# Patient Record
Sex: Male | Born: 1935
Health system: Southern US, Community
[De-identification: ages and names within clinical notes are randomized; demographics above are authoritative.]

## PROBLEM LIST (undated history)

## (undated) DIAGNOSIS — J3089 Other allergic rhinitis: Secondary | ICD-10-CM

## (undated) DIAGNOSIS — R0600 Dyspnea, unspecified: Secondary | ICD-10-CM

## (undated) DIAGNOSIS — E785 Hyperlipidemia, unspecified: Secondary | ICD-10-CM

## (undated) DIAGNOSIS — I10 Essential (primary) hypertension: Secondary | ICD-10-CM

## (undated) DIAGNOSIS — M1990 Unspecified osteoarthritis, unspecified site: Secondary | ICD-10-CM

## (undated) DIAGNOSIS — K219 Gastro-esophageal reflux disease without esophagitis: Secondary | ICD-10-CM

## (undated) DIAGNOSIS — H9190 Unspecified hearing loss, unspecified ear: Secondary | ICD-10-CM

## (undated) DIAGNOSIS — R42 Dizziness and giddiness: Secondary | ICD-10-CM

## (undated) DIAGNOSIS — R251 Tremor, unspecified: Secondary | ICD-10-CM

## (undated) HISTORY — PX: CATARACT EXTRACTION W/ INTRAOCULAR LENS IMPLANT: SHX1309

## (undated) HISTORY — PX: OTHER SURGICAL HISTORY: SHX169

---

## 2006-01-12 ENCOUNTER — Ambulatory Visit: Payer: Self-pay | Admitting: Ophthalmology

## 2006-01-18 ENCOUNTER — Ambulatory Visit: Payer: Self-pay | Admitting: Ophthalmology

## 2007-05-19 ENCOUNTER — Emergency Department: Payer: Self-pay | Admitting: Emergency Medicine

## 2010-05-14 ENCOUNTER — Ambulatory Visit: Payer: Self-pay | Admitting: Internal Medicine

## 2012-06-21 ENCOUNTER — Ambulatory Visit: Payer: Self-pay | Admitting: Internal Medicine

## 2012-06-23 ENCOUNTER — Ambulatory Visit: Payer: Self-pay | Admitting: Internal Medicine

## 2013-07-31 ENCOUNTER — Ambulatory Visit: Payer: Self-pay | Admitting: Physical Medicine and Rehabilitation

## 2014-07-07 DIAGNOSIS — N401 Enlarged prostate with lower urinary tract symptoms: Secondary | ICD-10-CM | POA: Diagnosis present

## 2015-04-19 ENCOUNTER — Emergency Department
Admit: 2015-04-19 | Disposition: A | Discharge status: Home / Self Care | Payer: Self-pay | Admitting: Emergency Medicine

## 2017-04-04 DIAGNOSIS — E1122 Type 2 diabetes mellitus with diabetic chronic kidney disease: Secondary | ICD-10-CM | POA: Diagnosis not present

## 2017-04-04 DIAGNOSIS — E785 Hyperlipidemia, unspecified: Secondary | ICD-10-CM | POA: Diagnosis not present

## 2017-04-04 DIAGNOSIS — E1169 Type 2 diabetes mellitus with other specified complication: Secondary | ICD-10-CM | POA: Diagnosis not present

## 2017-04-04 DIAGNOSIS — N183 Chronic kidney disease, stage 3 (moderate): Secondary | ICD-10-CM | POA: Diagnosis not present

## 2017-04-11 DIAGNOSIS — N183 Chronic kidney disease, stage 3 (moderate): Secondary | ICD-10-CM | POA: Diagnosis not present

## 2017-04-11 DIAGNOSIS — E785 Hyperlipidemia, unspecified: Secondary | ICD-10-CM | POA: Diagnosis not present

## 2017-04-11 DIAGNOSIS — I129 Hypertensive chronic kidney disease with stage 1 through stage 4 chronic kidney disease, or unspecified chronic kidney disease: Secondary | ICD-10-CM | POA: Diagnosis not present

## 2017-04-11 DIAGNOSIS — I700 Atherosclerosis of aorta: Secondary | ICD-10-CM | POA: Diagnosis not present

## 2017-04-11 DIAGNOSIS — R739 Hyperglycemia, unspecified: Secondary | ICD-10-CM | POA: Diagnosis not present

## 2017-04-11 DIAGNOSIS — E1169 Type 2 diabetes mellitus with other specified complication: Secondary | ICD-10-CM | POA: Diagnosis not present

## 2017-05-18 DIAGNOSIS — H2511 Age-related nuclear cataract, right eye: Secondary | ICD-10-CM | POA: Diagnosis not present

## 2017-05-24 DIAGNOSIS — H2512 Age-related nuclear cataract, left eye: Secondary | ICD-10-CM | POA: Diagnosis not present

## 2017-05-30 ENCOUNTER — Encounter: Payer: Self-pay | Admitting: *Deleted

## 2017-06-07 ENCOUNTER — Ambulatory Visit
Admission: RE | Admit: 2017-06-07 | Discharge: 2017-06-07 | Disposition: A | Discharge status: Home / Self Care | Payer: Medicare HMO | Source: Ambulatory Visit | Attending: Ophthalmology | Admitting: Ophthalmology

## 2017-06-07 ENCOUNTER — Ambulatory Visit: Payer: Medicare HMO | Admitting: Anesthesiology

## 2017-06-07 ENCOUNTER — Encounter
Admission: RE | Disposition: A | Discharge status: Home / Self Care | Payer: Self-pay | Source: Ambulatory Visit | Attending: Ophthalmology

## 2017-06-07 DIAGNOSIS — E7800 Pure hypercholesterolemia, unspecified: Secondary | ICD-10-CM | POA: Insufficient documentation

## 2017-06-07 DIAGNOSIS — M1990 Unspecified osteoarthritis, unspecified site: Secondary | ICD-10-CM | POA: Insufficient documentation

## 2017-06-07 DIAGNOSIS — H2512 Age-related nuclear cataract, left eye: Secondary | ICD-10-CM | POA: Diagnosis not present

## 2017-06-07 DIAGNOSIS — Z79899 Other long term (current) drug therapy: Secondary | ICD-10-CM | POA: Diagnosis not present

## 2017-06-07 DIAGNOSIS — K219 Gastro-esophageal reflux disease without esophagitis: Secondary | ICD-10-CM | POA: Diagnosis not present

## 2017-06-07 DIAGNOSIS — Z7982 Long term (current) use of aspirin: Secondary | ICD-10-CM | POA: Insufficient documentation

## 2017-06-07 DIAGNOSIS — I10 Essential (primary) hypertension: Secondary | ICD-10-CM | POA: Diagnosis not present

## 2017-06-07 HISTORY — DX: Dizziness and giddiness: R42

## 2017-06-07 HISTORY — DX: Tremor, unspecified: R25.1

## 2017-06-07 HISTORY — DX: Unspecified osteoarthritis, unspecified site: M19.90

## 2017-06-07 HISTORY — DX: Unspecified hearing loss, unspecified ear: H91.90

## 2017-06-07 HISTORY — DX: Hyperlipidemia, unspecified: E78.5

## 2017-06-07 HISTORY — DX: Dyspnea, unspecified: R06.00

## 2017-06-07 HISTORY — DX: Other allergic rhinitis: J30.89

## 2017-06-07 HISTORY — PX: CATARACT EXTRACTION W/PHACO: SHX586

## 2017-06-07 HISTORY — DX: Essential (primary) hypertension: I10

## 2017-06-07 HISTORY — DX: Gastro-esophageal reflux disease without esophagitis: K21.9

## 2017-06-07 SURGERY — PHACOEMULSIFICATION, CATARACT, WITH IOL INSERTION
Anesthesia: Monitor Anesthesia Care | Site: Eye | Laterality: Left | Wound class: Clean

## 2017-06-07 MED ORDER — MOXIFLOXACIN HCL 0.5 % OP SOLN
OPHTHALMIC | Status: AC
  Filled 2017-06-07: qty 3

## 2017-06-07 MED ORDER — ARMC OPHTHALMIC DILATING DROPS
OPHTHALMIC | Status: AC
  Administered 2017-06-07: 1 via OPHTHALMIC
  Filled 2017-06-07: qty 0.4

## 2017-06-07 MED ORDER — MOXIFLOXACIN HCL 0.5 % OP SOLN
OPHTHALMIC | Status: DC | PRN
  Administered 2017-06-07: .2 mL via OPHTHALMIC

## 2017-06-07 MED ORDER — EPINEPHRINE PF 1 MG/ML IJ SOLN
INTRAMUSCULAR | Status: AC
Start: 2017-06-07 — End: 2017-06-07
  Filled 2017-06-07: qty 2

## 2017-06-07 MED ORDER — SODIUM CHLORIDE 0.9 % IV SOLN: INTRAVENOUS | Status: DC

## 2017-06-07 MED ORDER — BSS IO SOLN
INTRAOCULAR | Status: DC | PRN
  Administered 2017-06-07: 2 mL via OPHTHALMIC

## 2017-06-07 MED ORDER — EPINEPHRINE PF 1 MG/ML IJ SOLN
INTRAOCULAR | Status: DC | PRN
  Administered 2017-06-07: 1 mL via OPHTHALMIC

## 2017-06-07 MED ORDER — NA CHONDROIT SULF-NA HYALURON 40-17 MG/ML IO SOLN
INTRAOCULAR | Status: AC
  Filled 2017-06-07: qty 1

## 2017-06-07 MED ORDER — MIDAZOLAM HCL 2 MG/2ML IJ SOLN
INTRAMUSCULAR | Status: AC
  Filled 2017-06-07: qty 2

## 2017-06-07 MED ORDER — POVIDONE-IODINE 5 % OP SOLN
OPHTHALMIC | Status: AC
  Filled 2017-06-07: qty 30

## 2017-06-07 MED ORDER — ARMC OPHTHALMIC DILATING DROPS
1.0000 "application " | OPHTHALMIC | Status: AC
  Administered 2017-06-07 (×3): 1 via OPHTHALMIC

## 2017-06-07 MED ORDER — POVIDONE-IODINE 5 % OP SOLN
OPHTHALMIC | Status: DC | PRN
  Administered 2017-06-07: 1 via OPHTHALMIC

## 2017-06-07 MED ORDER — CARBACHOL 0.01 % IO SOLN
INTRAOCULAR | Status: DC | PRN
  Administered 2017-06-07: .5 mL via INTRAOCULAR

## 2017-06-07 MED ORDER — MOXIFLOXACIN HCL 0.5 % OP SOLN: 1.0000 [drp] | OPHTHALMIC | Status: DC | PRN

## 2017-06-07 MED ORDER — LIDOCAINE HCL (PF) 4 % IJ SOLN
INTRAMUSCULAR | Status: AC
  Filled 2017-06-07: qty 5

## 2017-06-07 MED ORDER — NA CHONDROIT SULF-NA HYALURON 40-17 MG/ML IO SOLN
INTRAOCULAR | Status: DC | PRN
  Administered 2017-06-07: 1 mL via INTRAOCULAR

## 2017-06-07 MED ORDER — MIDAZOLAM HCL 2 MG/2ML IJ SOLN
INTRAMUSCULAR | Status: DC | PRN
  Administered 2017-06-07: 1 mg via INTRAVENOUS

## 2017-06-07 SURGICAL SUPPLY — 14 items
GLOVE BIO SURGEON STRL SZ8 (GLOVE) ×3 IMPLANT
GLOVE BIOGEL M 6.5 STRL (GLOVE) ×3 IMPLANT
GLOVE SURG LX 8.0 MICRO (GLOVE) ×2
GLOVE SURG LX STRL 8.0 MICRO (GLOVE) ×1 IMPLANT
GOWN STRL REUS W/ TWL LRG LVL3 (GOWN DISPOSABLE) ×2 IMPLANT
GOWN STRL REUS W/TWL LRG LVL3 (GOWN DISPOSABLE) ×4
LENS IOL ACRYSOF IQ 22.5 (Intraocular Lens) ×3 IMPLANT
PACK CATARACT (MISCELLANEOUS) ×3 IMPLANT
PACK CATARACT BRASINGTON LX (MISCELLANEOUS) ×3 IMPLANT
SOL BSS BAG (MISCELLANEOUS) ×3
SOLUTION BSS BAG (MISCELLANEOUS) ×1 IMPLANT
SYR 5ML LL (SYRINGE) ×3 IMPLANT
WATER STERILE IRR 250ML POUR (IV SOLUTION) ×3 IMPLANT
WIPE NON LINTING 3.25X3.25 (MISCELLANEOUS) ×3 IMPLANT

## 2017-06-07 NOTE — Transfer of Care (Signed)
Immediate Anesthesia Transfer of Care Note  Patient: John Andersen  Procedure(s) Performed: Procedure(s) with comments: CATARACT EXTRACTION PHACO AND INTRAOCULAR LENS PLACEMENT (IOC) (Left) - [place] 01:09 AP% 11.0 CDE 7.66 fluid pack lot #  Patient Location: PACU  Anesthesia Type:MAC  Level of Consciousness: awake and alert   Airway & Oxygen Therapy: Patient Spontanous Breathing  Post-op Assessment: Report given to RN and Post -op Vital signs reviewed and stable  Post vital signs: Reviewed and stable  Last Vitals:  Vitals:   05/30/17 1353 06/07/17 0903  BP: 139/65 (!) 171/72  Pulse: 67 80  Resp:  16  Temp:  36.2 C    Last Pain:  Vitals:   06/07/17 0903  TempSrc: Tympanic  PainSc: 7          Complications: No apparent anesthesia complications

## 2017-06-07 NOTE — Anesthesia Post-op Follow-up Note (Cosign Needed)
Anesthesia QCDR form completed.        

## 2017-06-07 NOTE — Op Note (Signed)
PREOPERATIVE DIAGNOSIS:  Nuclear sclerotic cataract of the left eye.   POSTOPERATIVE DIAGNOSIS:  Nuclear sclerotic cataract of the left eye.   OPERATIVE PROCEDURE: Procedure(s): CATARACT EXTRACTION PHACO AND INTRAOCULAR LENS PLACEMENT (IOC)   SURGEON:  Galen Manila, MD.   ANESTHESIA:  Anesthesiologist: Yves Dill, MD CRNA: Almeta Monas, CRNA  1.      Managed anesthesia care. 2.     0.40ml of Shugarcaine was instilled following the paracentesis   COMPLICATIONS:  None.   TECHNIQUE:   Stop and chop   DESCRIPTION OF PROCEDURE:  The patient was examined and consented in the preoperative holding area where the aforementioned topical anesthesia was applied to the left eye and then brought back to the Operating Room where the left eye was prepped and draped in the usual sterile ophthalmic fashion and a lid speculum was placed. A paracentesis was created with the side port blade and the anterior chamber was filled with viscoelastic. A near clear corneal incision was performed with the steel keratome. A continuous curvilinear capsulorrhexis was performed with a cystotome followed by the capsulorrhexis forceps. Hydrodissection and hydrodelineation were carried out with BSS on a blunt cannula. The lens was removed in a stop and chop  technique and the remaining cortical material was removed with the irrigation-aspiration handpiece. The capsular bag was inflated with viscoelastic and the Technis ZCB00 lens was placed in the capsular bag without complication. The remaining viscoelastic was removed from the eye with the irrigation-aspiration handpiece. The wounds were hydrated. The anterior chamber was flushed with Miostat and the eye was inflated to physiologic pressure. 0.69ml Vigamox was placed in the anterior chamber. The wounds were found to be water tight. The eye was dressed with Vigamox. The patient was given protective glasses to wear throughout the day and a shield with which to sleep  tonight. The patient was also given drops with which to begin a drop regimen today and will follow-up with me in one day. * No implants in log *  Procedure(s) with comments: CATARACT EXTRACTION PHACO AND INTRAOCULAR LENS PLACEMENT (IOC) (Left) - [place] 01:09 AP% 11.0 CDE 7.66 fluid pack lot #  Electronically signed: Kenndra Morris LOUIS 06/07/2017 10:34 AM

## 2017-06-07 NOTE — Discharge Instructions (Signed)
Eye Surgery Discharge Instructions  Expect mild scratchy sensation or mild soreness. DO NOT RUB YOUR EYE!  The day of surgery:  Minimal physical activity, but bed rest is not required  No reading, computer work, or close hand work  No bending, lifting, or straining.  May watch TV  For 24 hours:  No driving, legal decisions, or alcoholic beverages  Safety precautions  Eat anything you prefer: It is better to start with liquids, then soup then solid foods.  _____ Eye patch should be worn until postoperative exam tomorrow.  ____ Solar shield eyeglasses should be worn for comfort in the sunlight/patch while sleeping  Resume all regular medications including aspirin or Coumadin if these were discontinued prior to surgery. You may shower, bathe, shave, or wash your hair. Tylenol may be taken for mild discomfort.  Call your doctor if you experience significant pain, nausea, or vomiting, fever > 101 or other signs of infection. [phone] or [phone] Specific instructions:  Follow-up Information    Galen Manila, MD Follow up.   Specialty:  Ophthalmology Why:  June 20 at 9:05am Contact information: [address] [place] [address] [phone]

## 2017-06-07 NOTE — Anesthesia Postprocedure Evaluation (Signed)
Anesthesia Post Note  Patient: John Andersen  Procedure(s) Performed: Procedure(s) (LRB): CATARACT EXTRACTION PHACO AND INTRAOCULAR LENS PLACEMENT (IOC) (Left)  Patient location during evaluation: PACU Anesthesia Type: MAC Level of consciousness: awake and alert and oriented Pain management: pain level controlled Vital Signs Assessment: post-procedure vital signs reviewed and stable Respiratory status: spontaneous breathing Cardiovascular status: blood pressure returned to baseline Anesthetic complications: no     Last Vitals:  Vitals:   06/07/17 1037 06/07/17 1042  BP: (!) 149/58 (!) 147/64  Pulse: (!) 51   Resp: 14   Temp: 36.5 C     Last Pain:  Vitals:   06/07/17 0903  TempSrc: Tympanic  PainSc: 7                  Camary Sosa

## 2017-06-07 NOTE — Anesthesia Preprocedure Evaluation (Signed)
Anesthesia Evaluation  Patient identified by MRN, date of birth, ID band Patient awake    Reviewed: Allergy & Precautions, NPO status , Patient's Chart, lab work & pertinent test results  Airway Mallampati: II       Dental   Pulmonary shortness of breath and with exertion,    Pulmonary exam normal        Cardiovascular hypertension, Pt. on medications Normal cardiovascular exam     Neuro/Psych negative neurological ROS  negative psych ROS   GI/Hepatic Neg liver ROS, GERD  ,  Endo/Other  negative endocrine ROS  Renal/GU negative Renal ROS  negative genitourinary   Musculoskeletal  (+) Arthritis , Osteoarthritis,    Abdominal   Peds negative pediatric ROS (+)  Hematology negative hematology ROS (+)   Anesthesia Other Findings   Reproductive/Obstetrics                             Anesthesia Physical Anesthesia Plan  ASA: III  Anesthesia Plan: MAC   Post-op Pain Management:    Induction: Intravenous  PONV Risk Score and Plan:   Airway Management Planned:   Additional Equipment:   Intra-op Plan:   Post-operative Plan:   Informed Consent: I have reviewed the patients History and Physical, chart, labs and discussed the procedure including the risks, benefits and alternatives for the proposed anesthesia with the patient or authorized representative who has indicated his/her understanding and acceptance.   Dental advisory given  Plan Discussed with: CRNA and Surgeon  Anesthesia Plan Comments:         Anesthesia Quick Evaluation

## 2017-06-07 NOTE — H&P (Signed)
  All labs reviewed. Abnormal studies sent to patients PCP when indicated.  Previous H&P reviewed, patient examined, there are NO CHANGES.  John Andersen LOUIS6/19/201810:02 AM

## 2017-06-08 ENCOUNTER — Encounter: Payer: Self-pay | Admitting: Ophthalmology

## 2017-07-04 DIAGNOSIS — Z961 Presence of intraocular lens: Secondary | ICD-10-CM | POA: Diagnosis not present

## 2017-09-09 DIAGNOSIS — Z23 Encounter for immunization: Secondary | ICD-10-CM | POA: Diagnosis not present

## 2017-09-30 DIAGNOSIS — R079 Chest pain, unspecified: Secondary | ICD-10-CM | POA: Diagnosis not present

## 2017-09-30 DIAGNOSIS — R21 Rash and other nonspecific skin eruption: Secondary | ICD-10-CM | POA: Diagnosis not present

## 2017-09-30 DIAGNOSIS — E039 Hypothyroidism, unspecified: Secondary | ICD-10-CM | POA: Diagnosis not present

## 2017-09-30 DIAGNOSIS — R5383 Other fatigue: Secondary | ICD-10-CM | POA: Diagnosis not present

## 2017-09-30 DIAGNOSIS — R42 Dizziness and giddiness: Secondary | ICD-10-CM | POA: Diagnosis not present

## 2017-10-03 DIAGNOSIS — E039 Hypothyroidism, unspecified: Secondary | ICD-10-CM | POA: Diagnosis not present

## 2017-10-03 DIAGNOSIS — R5383 Other fatigue: Secondary | ICD-10-CM | POA: Diagnosis not present

## 2017-10-03 DIAGNOSIS — R42 Dizziness and giddiness: Secondary | ICD-10-CM | POA: Diagnosis not present

## 2017-10-03 DIAGNOSIS — R079 Chest pain, unspecified: Secondary | ICD-10-CM | POA: Diagnosis not present

## 2017-10-03 DIAGNOSIS — R21 Rash and other nonspecific skin eruption: Secondary | ICD-10-CM | POA: Diagnosis not present

## 2017-10-10 DIAGNOSIS — E785 Hyperlipidemia, unspecified: Secondary | ICD-10-CM | POA: Diagnosis not present

## 2017-10-10 DIAGNOSIS — R739 Hyperglycemia, unspecified: Secondary | ICD-10-CM | POA: Diagnosis not present

## 2017-10-10 DIAGNOSIS — I700 Atherosclerosis of aorta: Secondary | ICD-10-CM | POA: Diagnosis not present

## 2017-10-10 DIAGNOSIS — N183 Chronic kidney disease, stage 3 (moderate): Secondary | ICD-10-CM | POA: Diagnosis not present

## 2017-10-10 DIAGNOSIS — I129 Hypertensive chronic kidney disease with stage 1 through stage 4 chronic kidney disease, or unspecified chronic kidney disease: Secondary | ICD-10-CM | POA: Diagnosis not present

## 2017-10-10 DIAGNOSIS — E1169 Type 2 diabetes mellitus with other specified complication: Secondary | ICD-10-CM | POA: Diagnosis not present

## 2017-10-12 DIAGNOSIS — L820 Inflamed seborrheic keratosis: Secondary | ICD-10-CM | POA: Diagnosis not present

## 2017-10-12 DIAGNOSIS — L821 Other seborrheic keratosis: Secondary | ICD-10-CM | POA: Diagnosis not present

## 2017-10-17 DIAGNOSIS — E1169 Type 2 diabetes mellitus with other specified complication: Secondary | ICD-10-CM | POA: Diagnosis not present

## 2017-10-17 DIAGNOSIS — Z0000 Encounter for general adult medical examination without abnormal findings: Secondary | ICD-10-CM | POA: Diagnosis not present

## 2017-10-17 DIAGNOSIS — I700 Atherosclerosis of aorta: Secondary | ICD-10-CM | POA: Diagnosis not present

## 2017-10-17 DIAGNOSIS — E785 Hyperlipidemia, unspecified: Secondary | ICD-10-CM | POA: Diagnosis not present

## 2017-10-17 DIAGNOSIS — I129 Hypertensive chronic kidney disease with stage 1 through stage 4 chronic kidney disease, or unspecified chronic kidney disease: Secondary | ICD-10-CM | POA: Diagnosis not present

## 2017-10-17 DIAGNOSIS — E038 Other specified hypothyroidism: Secondary | ICD-10-CM | POA: Diagnosis not present

## 2017-10-17 DIAGNOSIS — R195 Other fecal abnormalities: Secondary | ICD-10-CM | POA: Diagnosis not present

## 2017-10-17 DIAGNOSIS — N183 Chronic kidney disease, stage 3 (moderate): Secondary | ICD-10-CM | POA: Diagnosis not present

## 2017-10-17 DIAGNOSIS — R739 Hyperglycemia, unspecified: Secondary | ICD-10-CM | POA: Diagnosis not present

## 2017-11-08 DIAGNOSIS — R195 Other fecal abnormalities: Secondary | ICD-10-CM | POA: Diagnosis not present

## 2017-11-21 DIAGNOSIS — H518 Other specified disorders of binocular movement: Secondary | ICD-10-CM | POA: Diagnosis not present

## 2017-12-15 DIAGNOSIS — E038 Other specified hypothyroidism: Secondary | ICD-10-CM | POA: Diagnosis not present

## 2017-12-15 DIAGNOSIS — E063 Autoimmune thyroiditis: Secondary | ICD-10-CM | POA: Diagnosis not present

## 2017-12-21 DIAGNOSIS — E1169 Type 2 diabetes mellitus with other specified complication: Secondary | ICD-10-CM | POA: Diagnosis not present

## 2017-12-21 DIAGNOSIS — E785 Hyperlipidemia, unspecified: Secondary | ICD-10-CM | POA: Diagnosis not present

## 2017-12-21 DIAGNOSIS — E038 Other specified hypothyroidism: Secondary | ICD-10-CM | POA: Diagnosis not present

## 2017-12-21 DIAGNOSIS — E063 Autoimmune thyroiditis: Secondary | ICD-10-CM | POA: Diagnosis not present

## 2017-12-21 DIAGNOSIS — N183 Chronic kidney disease, stage 3 (moderate): Secondary | ICD-10-CM | POA: Diagnosis not present

## 2017-12-21 DIAGNOSIS — I129 Hypertensive chronic kidney disease with stage 1 through stage 4 chronic kidney disease, or unspecified chronic kidney disease: Secondary | ICD-10-CM | POA: Diagnosis not present

## 2017-12-21 DIAGNOSIS — R739 Hyperglycemia, unspecified: Secondary | ICD-10-CM | POA: Diagnosis not present

## 2017-12-21 DIAGNOSIS — D638 Anemia in other chronic diseases classified elsewhere: Secondary | ICD-10-CM | POA: Diagnosis not present

## 2017-12-21 DIAGNOSIS — I700 Atherosclerosis of aorta: Secondary | ICD-10-CM | POA: Diagnosis not present

## 2018-03-20 DIAGNOSIS — E063 Autoimmune thyroiditis: Secondary | ICD-10-CM | POA: Diagnosis not present

## 2018-03-20 DIAGNOSIS — R739 Hyperglycemia, unspecified: Secondary | ICD-10-CM | POA: Diagnosis not present

## 2018-03-20 DIAGNOSIS — D638 Anemia in other chronic diseases classified elsewhere: Secondary | ICD-10-CM | POA: Diagnosis not present

## 2018-03-20 DIAGNOSIS — E1169 Type 2 diabetes mellitus with other specified complication: Secondary | ICD-10-CM | POA: Diagnosis not present

## 2018-03-20 DIAGNOSIS — E785 Hyperlipidemia, unspecified: Secondary | ICD-10-CM | POA: Diagnosis not present

## 2018-03-20 DIAGNOSIS — N183 Chronic kidney disease, stage 3 (moderate): Secondary | ICD-10-CM | POA: Diagnosis not present

## 2018-03-20 DIAGNOSIS — I129 Hypertensive chronic kidney disease with stage 1 through stage 4 chronic kidney disease, or unspecified chronic kidney disease: Secondary | ICD-10-CM | POA: Diagnosis not present

## 2018-03-20 DIAGNOSIS — E038 Other specified hypothyroidism: Secondary | ICD-10-CM | POA: Diagnosis not present

## 2018-03-27 DIAGNOSIS — E1169 Type 2 diabetes mellitus with other specified complication: Secondary | ICD-10-CM | POA: Diagnosis not present

## 2018-03-27 DIAGNOSIS — E038 Other specified hypothyroidism: Secondary | ICD-10-CM | POA: Diagnosis not present

## 2018-03-27 DIAGNOSIS — I129 Hypertensive chronic kidney disease with stage 1 through stage 4 chronic kidney disease, or unspecified chronic kidney disease: Secondary | ICD-10-CM | POA: Diagnosis not present

## 2018-03-27 DIAGNOSIS — E785 Hyperlipidemia, unspecified: Secondary | ICD-10-CM | POA: Diagnosis not present

## 2018-03-27 DIAGNOSIS — N183 Chronic kidney disease, stage 3 (moderate): Secondary | ICD-10-CM | POA: Diagnosis not present

## 2018-03-27 DIAGNOSIS — E063 Autoimmune thyroiditis: Secondary | ICD-10-CM | POA: Diagnosis not present

## 2018-03-28 DIAGNOSIS — H35373 Puckering of macula, bilateral: Secondary | ICD-10-CM | POA: Diagnosis not present

## 2018-05-16 DIAGNOSIS — J069 Acute upper respiratory infection, unspecified: Secondary | ICD-10-CM | POA: Diagnosis not present

## 2018-09-25 DIAGNOSIS — Z23 Encounter for immunization: Secondary | ICD-10-CM | POA: Diagnosis not present

## 2018-10-11 DIAGNOSIS — E1169 Type 2 diabetes mellitus with other specified complication: Secondary | ICD-10-CM | POA: Diagnosis not present

## 2018-10-11 DIAGNOSIS — N183 Chronic kidney disease, stage 3 (moderate): Secondary | ICD-10-CM | POA: Diagnosis not present

## 2018-10-11 DIAGNOSIS — E063 Autoimmune thyroiditis: Secondary | ICD-10-CM | POA: Diagnosis not present

## 2018-10-11 DIAGNOSIS — E038 Other specified hypothyroidism: Secondary | ICD-10-CM | POA: Diagnosis not present

## 2018-10-11 DIAGNOSIS — I129 Hypertensive chronic kidney disease with stage 1 through stage 4 chronic kidney disease, or unspecified chronic kidney disease: Secondary | ICD-10-CM | POA: Diagnosis not present

## 2018-10-11 DIAGNOSIS — E785 Hyperlipidemia, unspecified: Secondary | ICD-10-CM | POA: Diagnosis not present

## 2018-10-18 DIAGNOSIS — Z9989 Dependence on other enabling machines and devices: Secondary | ICD-10-CM | POA: Diagnosis not present

## 2018-10-18 DIAGNOSIS — R0602 Shortness of breath: Secondary | ICD-10-CM | POA: Diagnosis not present

## 2018-10-18 DIAGNOSIS — I129 Hypertensive chronic kidney disease with stage 1 through stage 4 chronic kidney disease, or unspecified chronic kidney disease: Secondary | ICD-10-CM | POA: Diagnosis not present

## 2018-10-18 DIAGNOSIS — E1169 Type 2 diabetes mellitus with other specified complication: Secondary | ICD-10-CM | POA: Diagnosis not present

## 2018-10-18 DIAGNOSIS — I700 Atherosclerosis of aorta: Secondary | ICD-10-CM | POA: Diagnosis not present

## 2018-10-18 DIAGNOSIS — R739 Hyperglycemia, unspecified: Secondary | ICD-10-CM | POA: Diagnosis not present

## 2018-10-18 DIAGNOSIS — N183 Chronic kidney disease, stage 3 (moderate): Secondary | ICD-10-CM | POA: Diagnosis not present

## 2018-10-18 DIAGNOSIS — E038 Other specified hypothyroidism: Secondary | ICD-10-CM | POA: Diagnosis not present

## 2018-10-18 DIAGNOSIS — Z0000 Encounter for general adult medical examination without abnormal findings: Secondary | ICD-10-CM | POA: Diagnosis not present

## 2018-11-03 DIAGNOSIS — R0602 Shortness of breath: Secondary | ICD-10-CM | POA: Diagnosis not present

## 2018-11-08 DIAGNOSIS — J9811 Atelectasis: Secondary | ICD-10-CM | POA: Diagnosis not present

## 2018-11-08 DIAGNOSIS — N183 Chronic kidney disease, stage 3 (moderate): Secondary | ICD-10-CM | POA: Diagnosis not present

## 2018-11-08 DIAGNOSIS — I700 Atherosclerosis of aorta: Secondary | ICD-10-CM | POA: Diagnosis not present

## 2018-11-08 DIAGNOSIS — E038 Other specified hypothyroidism: Secondary | ICD-10-CM | POA: Diagnosis not present

## 2018-11-08 DIAGNOSIS — R739 Hyperglycemia, unspecified: Secondary | ICD-10-CM | POA: Diagnosis not present

## 2018-11-08 DIAGNOSIS — Z9989 Dependence on other enabling machines and devices: Secondary | ICD-10-CM | POA: Diagnosis not present

## 2018-11-08 DIAGNOSIS — E785 Hyperlipidemia, unspecified: Secondary | ICD-10-CM | POA: Diagnosis not present

## 2018-11-08 DIAGNOSIS — E1169 Type 2 diabetes mellitus with other specified complication: Secondary | ICD-10-CM | POA: Diagnosis not present

## 2018-11-08 DIAGNOSIS — I129 Hypertensive chronic kidney disease with stage 1 through stage 4 chronic kidney disease, or unspecified chronic kidney disease: Secondary | ICD-10-CM | POA: Diagnosis not present

## 2018-11-08 DIAGNOSIS — R0602 Shortness of breath: Secondary | ICD-10-CM | POA: Diagnosis not present

## 2018-12-18 DIAGNOSIS — M48061 Spinal stenosis, lumbar region without neurogenic claudication: Secondary | ICD-10-CM | POA: Diagnosis not present

## 2018-12-18 DIAGNOSIS — M5136 Other intervertebral disc degeneration, lumbar region: Secondary | ICD-10-CM | POA: Diagnosis not present

## 2018-12-18 DIAGNOSIS — M5432 Sciatica, left side: Secondary | ICD-10-CM | POA: Diagnosis not present

## 2019-01-02 DIAGNOSIS — I129 Hypertensive chronic kidney disease with stage 1 through stage 4 chronic kidney disease, or unspecified chronic kidney disease: Secondary | ICD-10-CM | POA: Diagnosis not present

## 2019-01-02 DIAGNOSIS — I5032 Chronic diastolic (congestive) heart failure: Secondary | ICD-10-CM | POA: Diagnosis not present

## 2019-01-02 DIAGNOSIS — N183 Chronic kidney disease, stage 3 (moderate): Secondary | ICD-10-CM | POA: Diagnosis not present

## 2019-01-03 DIAGNOSIS — I129 Hypertensive chronic kidney disease with stage 1 through stage 4 chronic kidney disease, or unspecified chronic kidney disease: Secondary | ICD-10-CM | POA: Diagnosis not present

## 2019-01-03 DIAGNOSIS — N183 Chronic kidney disease, stage 3 (moderate): Secondary | ICD-10-CM | POA: Diagnosis not present

## 2019-01-09 DIAGNOSIS — E785 Hyperlipidemia, unspecified: Secondary | ICD-10-CM | POA: Diagnosis not present

## 2019-01-09 DIAGNOSIS — I129 Hypertensive chronic kidney disease with stage 1 through stage 4 chronic kidney disease, or unspecified chronic kidney disease: Secondary | ICD-10-CM | POA: Diagnosis not present

## 2019-01-09 DIAGNOSIS — I700 Atherosclerosis of aorta: Secondary | ICD-10-CM | POA: Diagnosis not present

## 2019-01-09 DIAGNOSIS — R0602 Shortness of breath: Secondary | ICD-10-CM | POA: Diagnosis not present

## 2019-01-09 DIAGNOSIS — N183 Chronic kidney disease, stage 3 (moderate): Secondary | ICD-10-CM | POA: Diagnosis not present

## 2019-01-09 DIAGNOSIS — E1169 Type 2 diabetes mellitus with other specified complication: Secondary | ICD-10-CM | POA: Diagnosis not present

## 2019-01-23 ENCOUNTER — Other Ambulatory Visit: Payer: Self-pay | Admitting: Internal Medicine

## 2019-01-23 DIAGNOSIS — M5442 Lumbago with sciatica, left side: Secondary | ICD-10-CM | POA: Diagnosis not present

## 2019-01-23 DIAGNOSIS — G8929 Other chronic pain: Secondary | ICD-10-CM

## 2019-01-23 DIAGNOSIS — E063 Autoimmune thyroiditis: Secondary | ICD-10-CM | POA: Diagnosis not present

## 2019-01-23 DIAGNOSIS — I129 Hypertensive chronic kidney disease with stage 1 through stage 4 chronic kidney disease, or unspecified chronic kidney disease: Secondary | ICD-10-CM | POA: Diagnosis not present

## 2019-01-23 DIAGNOSIS — E1169 Type 2 diabetes mellitus with other specified complication: Secondary | ICD-10-CM | POA: Diagnosis not present

## 2019-01-23 DIAGNOSIS — E785 Hyperlipidemia, unspecified: Secondary | ICD-10-CM | POA: Diagnosis not present

## 2019-01-23 DIAGNOSIS — N183 Chronic kidney disease, stage 3 (moderate): Secondary | ICD-10-CM | POA: Diagnosis not present

## 2019-01-23 DIAGNOSIS — E038 Other specified hypothyroidism: Secondary | ICD-10-CM | POA: Diagnosis not present

## 2019-01-23 DIAGNOSIS — I700 Atherosclerosis of aorta: Secondary | ICD-10-CM | POA: Diagnosis not present

## 2019-01-23 DIAGNOSIS — R739 Hyperglycemia, unspecified: Secondary | ICD-10-CM | POA: Diagnosis not present

## 2019-01-24 DIAGNOSIS — R0602 Shortness of breath: Secondary | ICD-10-CM | POA: Diagnosis not present

## 2019-02-02 ENCOUNTER — Ambulatory Visit
Admission: RE | Admit: 2019-02-02 | Discharge: 2019-02-02 | Disposition: A | Discharge status: Home / Self Care | Payer: Medicare HMO | Source: Ambulatory Visit | Attending: Internal Medicine | Admitting: Internal Medicine

## 2019-02-02 DIAGNOSIS — M5442 Lumbago with sciatica, left side: Secondary | ICD-10-CM | POA: Insufficient documentation

## 2019-02-02 DIAGNOSIS — G8929 Other chronic pain: Secondary | ICD-10-CM | POA: Diagnosis not present

## 2019-02-02 DIAGNOSIS — M545 Low back pain: Secondary | ICD-10-CM | POA: Diagnosis not present

## 2019-02-05 DIAGNOSIS — M48062 Spinal stenosis, lumbar region with neurogenic claudication: Secondary | ICD-10-CM | POA: Diagnosis not present

## 2019-02-05 DIAGNOSIS — M5416 Radiculopathy, lumbar region: Secondary | ICD-10-CM | POA: Diagnosis not present

## 2019-02-05 DIAGNOSIS — M5136 Other intervertebral disc degeneration, lumbar region: Secondary | ICD-10-CM | POA: Diagnosis not present

## 2019-02-05 DIAGNOSIS — M47816 Spondylosis without myelopathy or radiculopathy, lumbar region: Secondary | ICD-10-CM | POA: Diagnosis not present

## 2019-02-06 DIAGNOSIS — I129 Hypertensive chronic kidney disease with stage 1 through stage 4 chronic kidney disease, or unspecified chronic kidney disease: Secondary | ICD-10-CM | POA: Diagnosis not present

## 2019-02-06 DIAGNOSIS — I428 Other cardiomyopathies: Secondary | ICD-10-CM | POA: Diagnosis not present

## 2019-02-06 DIAGNOSIS — R0602 Shortness of breath: Secondary | ICD-10-CM | POA: Diagnosis not present

## 2019-02-06 DIAGNOSIS — E785 Hyperlipidemia, unspecified: Secondary | ICD-10-CM | POA: Diagnosis not present

## 2019-02-06 DIAGNOSIS — I700 Atherosclerosis of aorta: Secondary | ICD-10-CM | POA: Diagnosis not present

## 2019-02-06 DIAGNOSIS — N183 Chronic kidney disease, stage 3 (moderate): Secondary | ICD-10-CM | POA: Diagnosis not present

## 2019-02-06 DIAGNOSIS — E1169 Type 2 diabetes mellitus with other specified complication: Secondary | ICD-10-CM | POA: Diagnosis not present

## 2019-02-08 DIAGNOSIS — M4726 Other spondylosis with radiculopathy, lumbar region: Secondary | ICD-10-CM | POA: Diagnosis not present

## 2019-02-08 DIAGNOSIS — K589 Irritable bowel syndrome without diarrhea: Secondary | ICD-10-CM | POA: Diagnosis not present

## 2019-02-08 DIAGNOSIS — N183 Chronic kidney disease, stage 3 (moderate): Secondary | ICD-10-CM | POA: Diagnosis not present

## 2019-02-08 DIAGNOSIS — M5116 Intervertebral disc disorders with radiculopathy, lumbar region: Secondary | ICD-10-CM | POA: Diagnosis not present

## 2019-02-08 DIAGNOSIS — E119 Type 2 diabetes mellitus without complications: Secondary | ICD-10-CM | POA: Diagnosis not present

## 2019-02-08 DIAGNOSIS — M48062 Spinal stenosis, lumbar region with neurogenic claudication: Secondary | ICD-10-CM | POA: Diagnosis not present

## 2019-02-08 DIAGNOSIS — I5032 Chronic diastolic (congestive) heart failure: Secondary | ICD-10-CM | POA: Diagnosis not present

## 2019-02-08 DIAGNOSIS — I700 Atherosclerosis of aorta: Secondary | ICD-10-CM | POA: Diagnosis not present

## 2019-02-08 DIAGNOSIS — I130 Hypertensive heart and chronic kidney disease with heart failure and stage 1 through stage 4 chronic kidney disease, or unspecified chronic kidney disease: Secondary | ICD-10-CM | POA: Diagnosis not present

## 2019-02-12 DIAGNOSIS — M48062 Spinal stenosis, lumbar region with neurogenic claudication: Secondary | ICD-10-CM | POA: Diagnosis not present

## 2019-02-12 DIAGNOSIS — N183 Chronic kidney disease, stage 3 (moderate): Secondary | ICD-10-CM | POA: Diagnosis not present

## 2019-02-12 DIAGNOSIS — M5116 Intervertebral disc disorders with radiculopathy, lumbar region: Secondary | ICD-10-CM | POA: Diagnosis not present

## 2019-02-12 DIAGNOSIS — K589 Irritable bowel syndrome without diarrhea: Secondary | ICD-10-CM | POA: Diagnosis not present

## 2019-02-12 DIAGNOSIS — M4726 Other spondylosis with radiculopathy, lumbar region: Secondary | ICD-10-CM | POA: Diagnosis not present

## 2019-02-12 DIAGNOSIS — I130 Hypertensive heart and chronic kidney disease with heart failure and stage 1 through stage 4 chronic kidney disease, or unspecified chronic kidney disease: Secondary | ICD-10-CM | POA: Diagnosis not present

## 2019-02-12 DIAGNOSIS — E119 Type 2 diabetes mellitus without complications: Secondary | ICD-10-CM | POA: Diagnosis not present

## 2019-02-12 DIAGNOSIS — I5032 Chronic diastolic (congestive) heart failure: Secondary | ICD-10-CM | POA: Diagnosis not present

## 2019-02-12 DIAGNOSIS — I700 Atherosclerosis of aorta: Secondary | ICD-10-CM | POA: Diagnosis not present

## 2019-02-14 DIAGNOSIS — N183 Chronic kidney disease, stage 3 (moderate): Secondary | ICD-10-CM | POA: Diagnosis not present

## 2019-02-14 DIAGNOSIS — M4726 Other spondylosis with radiculopathy, lumbar region: Secondary | ICD-10-CM | POA: Diagnosis not present

## 2019-02-14 DIAGNOSIS — I5032 Chronic diastolic (congestive) heart failure: Secondary | ICD-10-CM | POA: Diagnosis not present

## 2019-02-14 DIAGNOSIS — I700 Atherosclerosis of aorta: Secondary | ICD-10-CM | POA: Diagnosis not present

## 2019-02-14 DIAGNOSIS — E119 Type 2 diabetes mellitus without complications: Secondary | ICD-10-CM | POA: Diagnosis not present

## 2019-02-14 DIAGNOSIS — M48062 Spinal stenosis, lumbar region with neurogenic claudication: Secondary | ICD-10-CM | POA: Diagnosis not present

## 2019-02-14 DIAGNOSIS — I130 Hypertensive heart and chronic kidney disease with heart failure and stage 1 through stage 4 chronic kidney disease, or unspecified chronic kidney disease: Secondary | ICD-10-CM | POA: Diagnosis not present

## 2019-02-14 DIAGNOSIS — K589 Irritable bowel syndrome without diarrhea: Secondary | ICD-10-CM | POA: Diagnosis not present

## 2019-02-14 DIAGNOSIS — M5116 Intervertebral disc disorders with radiculopathy, lumbar region: Secondary | ICD-10-CM | POA: Diagnosis not present

## 2019-02-20 DIAGNOSIS — K589 Irritable bowel syndrome without diarrhea: Secondary | ICD-10-CM | POA: Diagnosis not present

## 2019-02-20 DIAGNOSIS — I130 Hypertensive heart and chronic kidney disease with heart failure and stage 1 through stage 4 chronic kidney disease, or unspecified chronic kidney disease: Secondary | ICD-10-CM | POA: Diagnosis not present

## 2019-02-20 DIAGNOSIS — N183 Chronic kidney disease, stage 3 (moderate): Secondary | ICD-10-CM | POA: Diagnosis not present

## 2019-02-20 DIAGNOSIS — M5116 Intervertebral disc disorders with radiculopathy, lumbar region: Secondary | ICD-10-CM | POA: Diagnosis not present

## 2019-02-20 DIAGNOSIS — E119 Type 2 diabetes mellitus without complications: Secondary | ICD-10-CM | POA: Diagnosis not present

## 2019-02-20 DIAGNOSIS — I700 Atherosclerosis of aorta: Secondary | ICD-10-CM | POA: Diagnosis not present

## 2019-02-20 DIAGNOSIS — M4726 Other spondylosis with radiculopathy, lumbar region: Secondary | ICD-10-CM | POA: Diagnosis not present

## 2019-02-20 DIAGNOSIS — I5032 Chronic diastolic (congestive) heart failure: Secondary | ICD-10-CM | POA: Diagnosis not present

## 2019-02-20 DIAGNOSIS — M48062 Spinal stenosis, lumbar region with neurogenic claudication: Secondary | ICD-10-CM | POA: Diagnosis not present

## 2019-02-21 DIAGNOSIS — Z9989 Dependence on other enabling machines and devices: Secondary | ICD-10-CM | POA: Diagnosis not present

## 2019-02-21 DIAGNOSIS — N183 Chronic kidney disease, stage 3 (moderate): Secondary | ICD-10-CM | POA: Diagnosis not present

## 2019-02-21 DIAGNOSIS — E785 Hyperlipidemia, unspecified: Secondary | ICD-10-CM | POA: Diagnosis not present

## 2019-02-21 DIAGNOSIS — I700 Atherosclerosis of aorta: Secondary | ICD-10-CM | POA: Diagnosis not present

## 2019-02-21 DIAGNOSIS — E063 Autoimmune thyroiditis: Secondary | ICD-10-CM | POA: Diagnosis not present

## 2019-02-21 DIAGNOSIS — E1169 Type 2 diabetes mellitus with other specified complication: Secondary | ICD-10-CM | POA: Diagnosis not present

## 2019-02-21 DIAGNOSIS — I428 Other cardiomyopathies: Secondary | ICD-10-CM | POA: Diagnosis not present

## 2019-02-21 DIAGNOSIS — E038 Other specified hypothyroidism: Secondary | ICD-10-CM | POA: Diagnosis not present

## 2019-02-21 DIAGNOSIS — I129 Hypertensive chronic kidney disease with stage 1 through stage 4 chronic kidney disease, or unspecified chronic kidney disease: Secondary | ICD-10-CM | POA: Diagnosis not present

## 2019-02-22 DIAGNOSIS — M48062 Spinal stenosis, lumbar region with neurogenic claudication: Secondary | ICD-10-CM | POA: Diagnosis not present

## 2019-02-22 DIAGNOSIS — M5116 Intervertebral disc disorders with radiculopathy, lumbar region: Secondary | ICD-10-CM | POA: Diagnosis not present

## 2019-02-22 DIAGNOSIS — N183 Chronic kidney disease, stage 3 (moderate): Secondary | ICD-10-CM | POA: Diagnosis not present

## 2019-02-22 DIAGNOSIS — E119 Type 2 diabetes mellitus without complications: Secondary | ICD-10-CM | POA: Diagnosis not present

## 2019-02-22 DIAGNOSIS — I5032 Chronic diastolic (congestive) heart failure: Secondary | ICD-10-CM | POA: Diagnosis not present

## 2019-02-22 DIAGNOSIS — K589 Irritable bowel syndrome without diarrhea: Secondary | ICD-10-CM | POA: Diagnosis not present

## 2019-02-22 DIAGNOSIS — M4726 Other spondylosis with radiculopathy, lumbar region: Secondary | ICD-10-CM | POA: Diagnosis not present

## 2019-02-22 DIAGNOSIS — I700 Atherosclerosis of aorta: Secondary | ICD-10-CM | POA: Diagnosis not present

## 2019-02-22 DIAGNOSIS — I130 Hypertensive heart and chronic kidney disease with heart failure and stage 1 through stage 4 chronic kidney disease, or unspecified chronic kidney disease: Secondary | ICD-10-CM | POA: Diagnosis not present

## 2019-02-26 DIAGNOSIS — M4726 Other spondylosis with radiculopathy, lumbar region: Secondary | ICD-10-CM | POA: Diagnosis not present

## 2019-02-26 DIAGNOSIS — M48062 Spinal stenosis, lumbar region with neurogenic claudication: Secondary | ICD-10-CM | POA: Diagnosis not present

## 2019-02-26 DIAGNOSIS — M5116 Intervertebral disc disorders with radiculopathy, lumbar region: Secondary | ICD-10-CM | POA: Diagnosis not present

## 2019-02-26 DIAGNOSIS — N183 Chronic kidney disease, stage 3 (moderate): Secondary | ICD-10-CM | POA: Diagnosis not present

## 2019-02-26 DIAGNOSIS — I700 Atherosclerosis of aorta: Secondary | ICD-10-CM | POA: Diagnosis not present

## 2019-02-26 DIAGNOSIS — I130 Hypertensive heart and chronic kidney disease with heart failure and stage 1 through stage 4 chronic kidney disease, or unspecified chronic kidney disease: Secondary | ICD-10-CM | POA: Diagnosis not present

## 2019-02-26 DIAGNOSIS — I5032 Chronic diastolic (congestive) heart failure: Secondary | ICD-10-CM | POA: Diagnosis not present

## 2019-02-26 DIAGNOSIS — K589 Irritable bowel syndrome without diarrhea: Secondary | ICD-10-CM | POA: Diagnosis not present

## 2019-02-26 DIAGNOSIS — E119 Type 2 diabetes mellitus without complications: Secondary | ICD-10-CM | POA: Diagnosis not present

## 2019-03-05 DIAGNOSIS — J45901 Unspecified asthma with (acute) exacerbation: Secondary | ICD-10-CM | POA: Diagnosis not present

## 2019-03-19 DIAGNOSIS — M5136 Other intervertebral disc degeneration, lumbar region: Secondary | ICD-10-CM | POA: Diagnosis not present

## 2019-05-18 DIAGNOSIS — E038 Other specified hypothyroidism: Secondary | ICD-10-CM | POA: Diagnosis not present

## 2019-05-18 DIAGNOSIS — I428 Other cardiomyopathies: Secondary | ICD-10-CM | POA: Diagnosis not present

## 2019-05-18 DIAGNOSIS — R7309 Other abnormal glucose: Secondary | ICD-10-CM | POA: Diagnosis not present

## 2019-05-18 DIAGNOSIS — N183 Chronic kidney disease, stage 3 (moderate): Secondary | ICD-10-CM | POA: Diagnosis not present

## 2019-05-18 DIAGNOSIS — E063 Autoimmune thyroiditis: Secondary | ICD-10-CM | POA: Diagnosis not present

## 2019-05-18 DIAGNOSIS — I700 Atherosclerosis of aorta: Secondary | ICD-10-CM | POA: Diagnosis not present

## 2019-05-18 DIAGNOSIS — I129 Hypertensive chronic kidney disease with stage 1 through stage 4 chronic kidney disease, or unspecified chronic kidney disease: Secondary | ICD-10-CM | POA: Diagnosis not present

## 2019-05-25 DIAGNOSIS — Z9989 Dependence on other enabling machines and devices: Secondary | ICD-10-CM | POA: Diagnosis not present

## 2019-05-25 DIAGNOSIS — I129 Hypertensive chronic kidney disease with stage 1 through stage 4 chronic kidney disease, or unspecified chronic kidney disease: Secondary | ICD-10-CM | POA: Diagnosis not present

## 2019-05-25 DIAGNOSIS — I428 Other cardiomyopathies: Secondary | ICD-10-CM | POA: Diagnosis not present

## 2019-05-25 DIAGNOSIS — E063 Autoimmune thyroiditis: Secondary | ICD-10-CM | POA: Diagnosis not present

## 2019-05-25 DIAGNOSIS — I700 Atherosclerosis of aorta: Secondary | ICD-10-CM | POA: Diagnosis not present

## 2019-05-25 DIAGNOSIS — E1169 Type 2 diabetes mellitus with other specified complication: Secondary | ICD-10-CM | POA: Diagnosis not present

## 2019-05-25 DIAGNOSIS — E038 Other specified hypothyroidism: Secondary | ICD-10-CM | POA: Diagnosis not present

## 2019-05-25 DIAGNOSIS — N183 Chronic kidney disease, stage 3 (moderate): Secondary | ICD-10-CM | POA: Diagnosis not present

## 2019-05-25 DIAGNOSIS — R7309 Other abnormal glucose: Secondary | ICD-10-CM | POA: Diagnosis not present

## 2019-05-25 DIAGNOSIS — R7303 Prediabetes: Secondary | ICD-10-CM | POA: Insufficient documentation

## 2019-06-05 DIAGNOSIS — M5416 Radiculopathy, lumbar region: Secondary | ICD-10-CM | POA: Diagnosis not present

## 2019-06-05 DIAGNOSIS — M5136 Other intervertebral disc degeneration, lumbar region: Secondary | ICD-10-CM | POA: Diagnosis not present

## 2019-06-05 DIAGNOSIS — M48062 Spinal stenosis, lumbar region with neurogenic claudication: Secondary | ICD-10-CM | POA: Diagnosis not present

## 2019-06-05 DIAGNOSIS — M47816 Spondylosis without myelopathy or radiculopathy, lumbar region: Secondary | ICD-10-CM | POA: Diagnosis not present

## 2019-08-10 DIAGNOSIS — Z961 Presence of intraocular lens: Secondary | ICD-10-CM | POA: Diagnosis not present

## 2019-09-07 DIAGNOSIS — Z23 Encounter for immunization: Secondary | ICD-10-CM | POA: Diagnosis not present

## 2019-11-26 DIAGNOSIS — I129 Hypertensive chronic kidney disease with stage 1 through stage 4 chronic kidney disease, or unspecified chronic kidney disease: Secondary | ICD-10-CM | POA: Diagnosis not present

## 2019-11-26 DIAGNOSIS — E063 Autoimmune thyroiditis: Secondary | ICD-10-CM | POA: Diagnosis not present

## 2019-11-26 DIAGNOSIS — R7309 Other abnormal glucose: Secondary | ICD-10-CM | POA: Diagnosis not present

## 2019-11-26 DIAGNOSIS — E038 Other specified hypothyroidism: Secondary | ICD-10-CM | POA: Diagnosis not present

## 2019-11-26 DIAGNOSIS — N183 Chronic kidney disease, stage 3 unspecified: Secondary | ICD-10-CM | POA: Diagnosis not present

## 2019-12-03 DIAGNOSIS — E063 Autoimmune thyroiditis: Secondary | ICD-10-CM | POA: Diagnosis not present

## 2019-12-03 DIAGNOSIS — N183 Chronic kidney disease, stage 3 unspecified: Secondary | ICD-10-CM | POA: Diagnosis not present

## 2019-12-03 DIAGNOSIS — Z0000 Encounter for general adult medical examination without abnormal findings: Secondary | ICD-10-CM | POA: Diagnosis not present

## 2019-12-03 DIAGNOSIS — E1169 Type 2 diabetes mellitus with other specified complication: Secondary | ICD-10-CM | POA: Diagnosis not present

## 2019-12-03 DIAGNOSIS — E038 Other specified hypothyroidism: Secondary | ICD-10-CM | POA: Diagnosis not present

## 2019-12-03 DIAGNOSIS — R262 Difficulty in walking, not elsewhere classified: Secondary | ICD-10-CM | POA: Diagnosis not present

## 2019-12-03 DIAGNOSIS — E785 Hyperlipidemia, unspecified: Secondary | ICD-10-CM | POA: Diagnosis not present

## 2019-12-03 DIAGNOSIS — I428 Other cardiomyopathies: Secondary | ICD-10-CM | POA: Diagnosis not present

## 2019-12-03 DIAGNOSIS — I129 Hypertensive chronic kidney disease with stage 1 through stage 4 chronic kidney disease, or unspecified chronic kidney disease: Secondary | ICD-10-CM | POA: Diagnosis not present

## 2020-02-25 DIAGNOSIS — N183 Chronic kidney disease, stage 3 unspecified: Secondary | ICD-10-CM | POA: Diagnosis not present

## 2020-02-25 DIAGNOSIS — R0602 Shortness of breath: Secondary | ICD-10-CM | POA: Diagnosis not present

## 2020-02-25 DIAGNOSIS — I428 Other cardiomyopathies: Secondary | ICD-10-CM | POA: Diagnosis not present

## 2020-02-25 DIAGNOSIS — I129 Hypertensive chronic kidney disease with stage 1 through stage 4 chronic kidney disease, or unspecified chronic kidney disease: Secondary | ICD-10-CM | POA: Diagnosis not present

## 2020-03-14 DIAGNOSIS — R0602 Shortness of breath: Secondary | ICD-10-CM | POA: Diagnosis not present

## 2020-03-14 DIAGNOSIS — I428 Other cardiomyopathies: Secondary | ICD-10-CM | POA: Diagnosis not present

## 2020-03-31 DIAGNOSIS — I700 Atherosclerosis of aorta: Secondary | ICD-10-CM | POA: Diagnosis not present

## 2020-03-31 DIAGNOSIS — I428 Other cardiomyopathies: Secondary | ICD-10-CM | POA: Diagnosis not present

## 2020-03-31 DIAGNOSIS — E1169 Type 2 diabetes mellitus with other specified complication: Secondary | ICD-10-CM | POA: Diagnosis not present

## 2020-03-31 DIAGNOSIS — I129 Hypertensive chronic kidney disease with stage 1 through stage 4 chronic kidney disease, or unspecified chronic kidney disease: Secondary | ICD-10-CM | POA: Diagnosis not present

## 2020-03-31 DIAGNOSIS — E785 Hyperlipidemia, unspecified: Secondary | ICD-10-CM | POA: Diagnosis not present

## 2020-03-31 DIAGNOSIS — N183 Chronic kidney disease, stage 3 unspecified: Secondary | ICD-10-CM | POA: Diagnosis not present

## 2020-05-12 DIAGNOSIS — N183 Chronic kidney disease, stage 3 unspecified: Secondary | ICD-10-CM | POA: Diagnosis not present

## 2020-05-12 DIAGNOSIS — E785 Hyperlipidemia, unspecified: Secondary | ICD-10-CM | POA: Diagnosis not present

## 2020-05-12 DIAGNOSIS — I428 Other cardiomyopathies: Secondary | ICD-10-CM | POA: Diagnosis not present

## 2020-05-12 DIAGNOSIS — I129 Hypertensive chronic kidney disease with stage 1 through stage 4 chronic kidney disease, or unspecified chronic kidney disease: Secondary | ICD-10-CM | POA: Diagnosis not present

## 2020-05-12 DIAGNOSIS — E1169 Type 2 diabetes mellitus with other specified complication: Secondary | ICD-10-CM | POA: Diagnosis not present

## 2020-05-12 DIAGNOSIS — I700 Atherosclerosis of aorta: Secondary | ICD-10-CM | POA: Diagnosis not present

## 2020-05-26 DIAGNOSIS — N183 Chronic kidney disease, stage 3 unspecified: Secondary | ICD-10-CM | POA: Diagnosis not present

## 2020-05-26 DIAGNOSIS — I129 Hypertensive chronic kidney disease with stage 1 through stage 4 chronic kidney disease, or unspecified chronic kidney disease: Secondary | ICD-10-CM | POA: Diagnosis not present

## 2020-05-26 DIAGNOSIS — R7309 Other abnormal glucose: Secondary | ICD-10-CM | POA: Diagnosis not present

## 2020-05-26 DIAGNOSIS — I428 Other cardiomyopathies: Secondary | ICD-10-CM | POA: Diagnosis not present

## 2020-06-02 DIAGNOSIS — E038 Other specified hypothyroidism: Secondary | ICD-10-CM | POA: Diagnosis not present

## 2020-06-02 DIAGNOSIS — M48061 Spinal stenosis, lumbar region without neurogenic claudication: Secondary | ICD-10-CM | POA: Diagnosis not present

## 2020-06-02 DIAGNOSIS — I129 Hypertensive chronic kidney disease with stage 1 through stage 4 chronic kidney disease, or unspecified chronic kidney disease: Secondary | ICD-10-CM | POA: Diagnosis not present

## 2020-06-02 DIAGNOSIS — R7309 Other abnormal glucose: Secondary | ICD-10-CM | POA: Diagnosis not present

## 2020-06-02 DIAGNOSIS — N183 Chronic kidney disease, stage 3 unspecified: Secondary | ICD-10-CM | POA: Diagnosis not present

## 2020-06-02 DIAGNOSIS — Z9989 Dependence on other enabling machines and devices: Secondary | ICD-10-CM | POA: Diagnosis not present

## 2020-06-02 DIAGNOSIS — E1169 Type 2 diabetes mellitus with other specified complication: Secondary | ICD-10-CM | POA: Diagnosis not present

## 2020-06-02 DIAGNOSIS — I428 Other cardiomyopathies: Secondary | ICD-10-CM | POA: Diagnosis not present

## 2020-06-02 DIAGNOSIS — I700 Atherosclerosis of aorta: Secondary | ICD-10-CM | POA: Diagnosis not present

## 2020-07-03 IMAGING — MR MR LUMBAR SPINE W/O CM
5 series · 30 of 48 positions shown · non-contrast
Comparison: Radiography 8692.  MRI 07/31/2013.

CLINICAL DATA: Low back pain and left leg pain, worsened over the
last month.

EXAM:
MRI LUMBAR SPINE WITHOUT CONTRAST
TECHNIQUE: Multiplanar, multisequence MR imaging of the lumbar spine was
performed. No intravenous contrast was administered.

[Series 5: T2 · sagittal · 4.0mm · 0.81mm/px · 6 of 17 slices shown (1 of 2)]
[im 1/17]
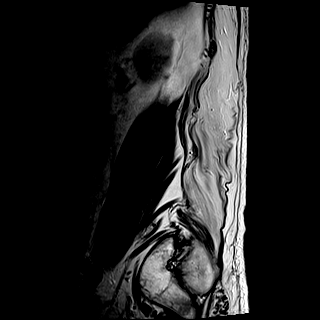
[im 4/17]
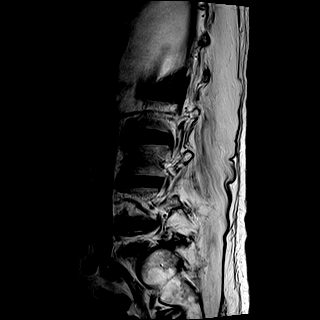
[im 7/17]
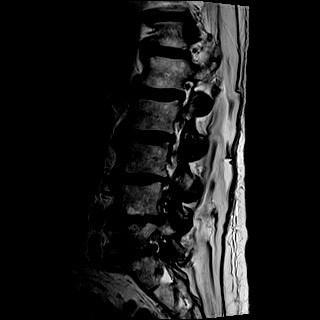
[im 10/17]
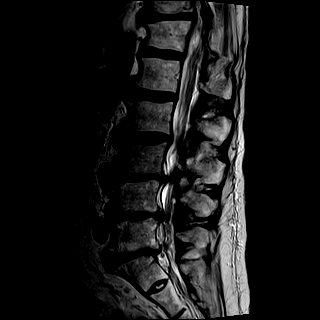
[im 13/17]
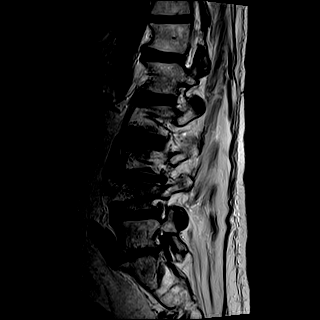
[im 17/17]
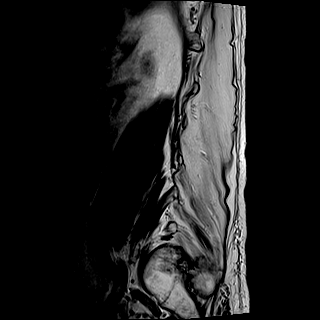

[Series 6: T1 · sagittal · 4.0mm · 0.81mm/px · 7 of 17 slices shown (1 of 2)]
[im 1/17]
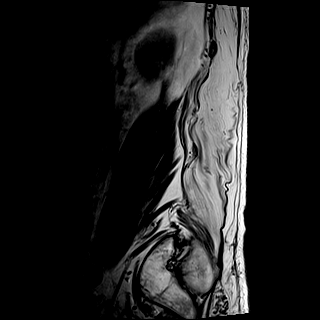
[im 3/17]
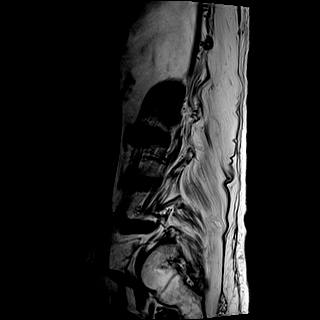
[im 6/17]
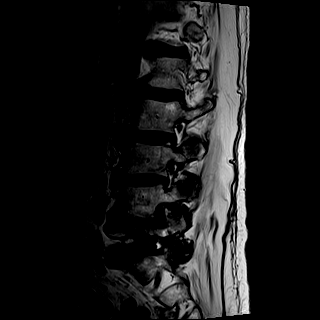
[im 9/17]
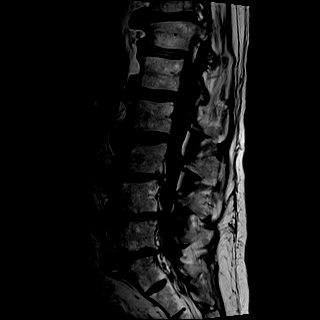
[im 11/17]
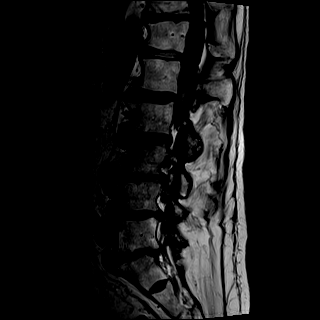
[im 14/17]
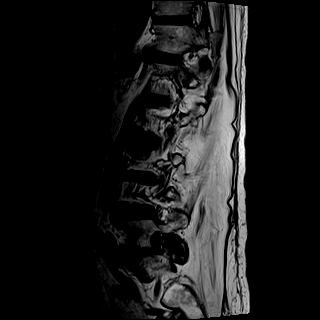
[im 17/17]
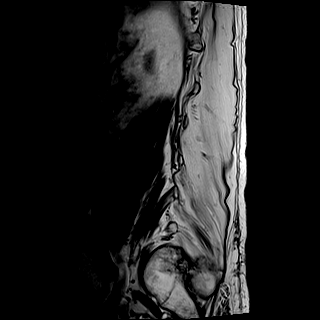

[Series 7: STIR · sagittal · 4.0mm · 0.41mm/px · 1 of 17 slices shown]
[im 1/17]
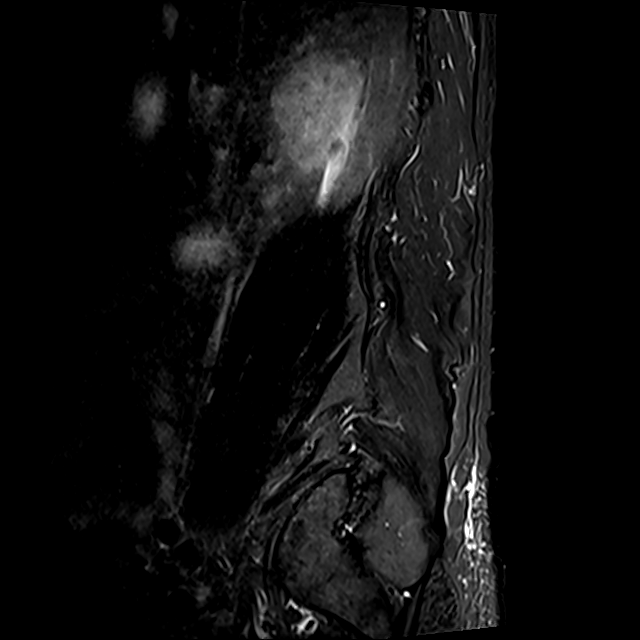

[Series 8: T2 · axial · 4.0mm · 0.78mm/px · z∈[-66,+132]mm · 8 of 36 slices shown (2 of 2)]
[im 1/36]
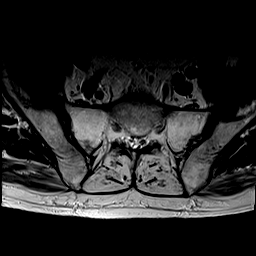
[im 6/36]
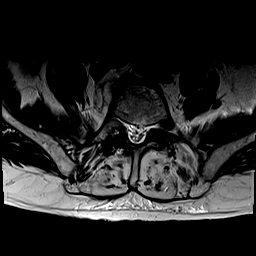
[im 11/36]
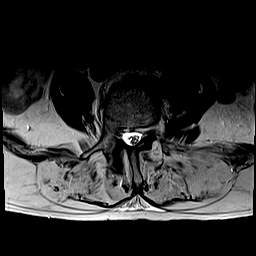
[im 17/36]
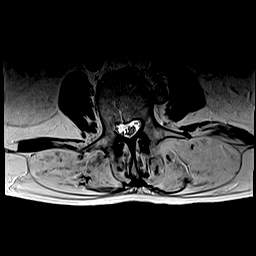
[im 19/36]
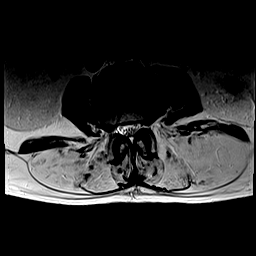
[im 25/36]
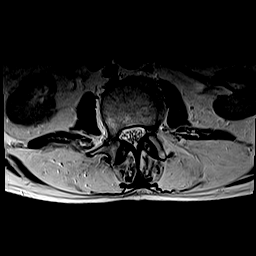
[im 30/36]
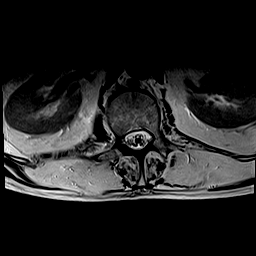
[im 36/36]
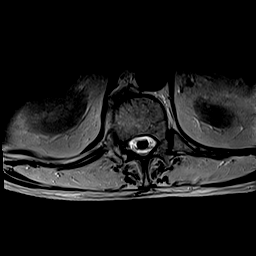

[Series 9: T1 · axial · 4.0mm · 0.39mm/px · z∈[-66,+132]mm · 8 of 36 slices shown (2 of 2)]
[im 1/36]
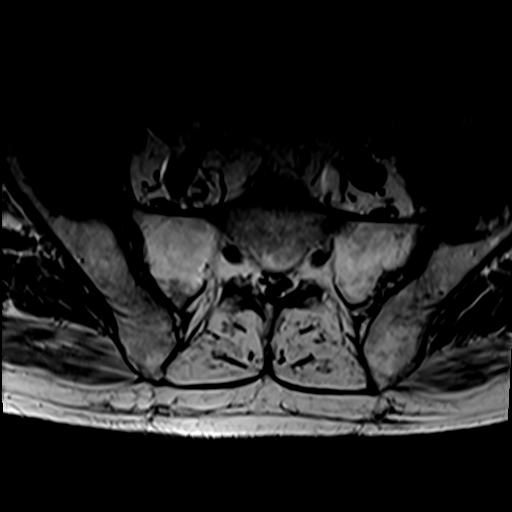
[im 6/36]
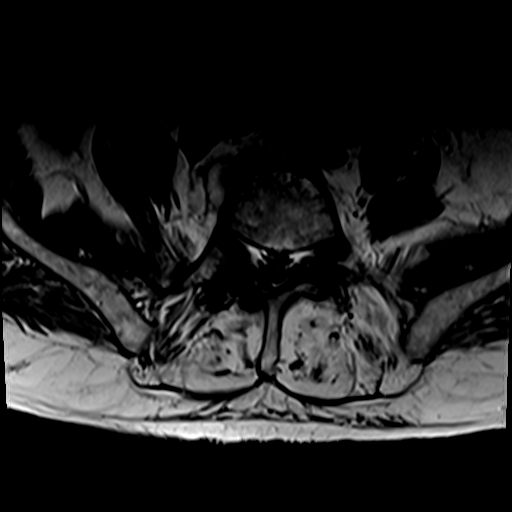
[im 11/36]
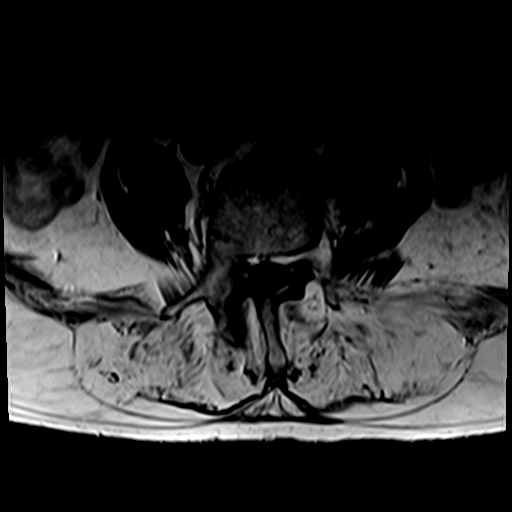
[im 17/36]
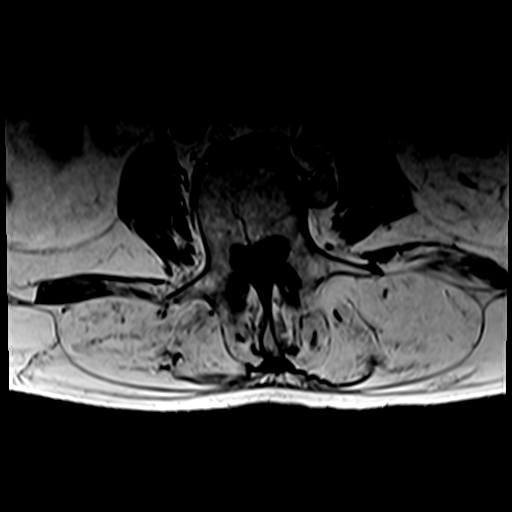
[im 19/36]
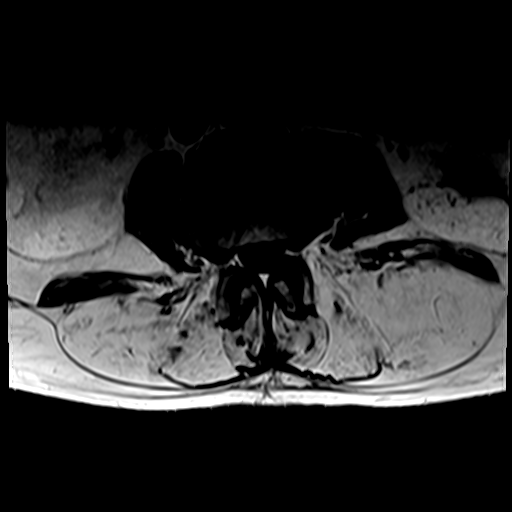
[im 25/36]
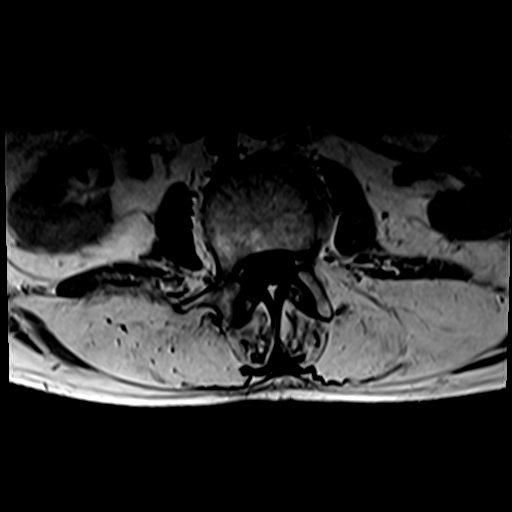
[im 30/36]
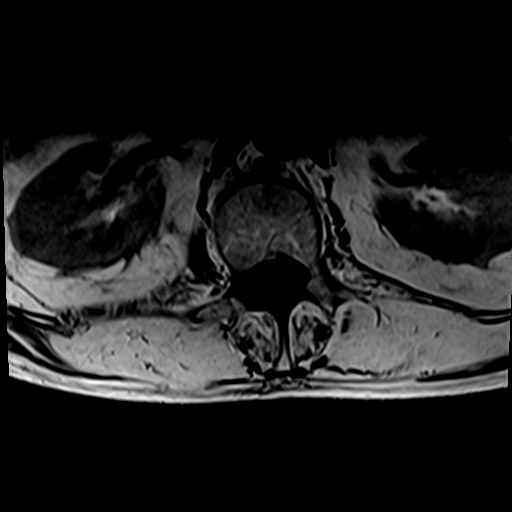
[im 36/36]
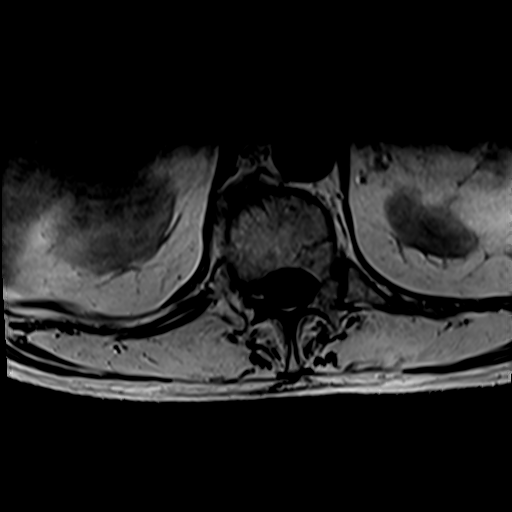

[30 of 48 positions shown; findings below may reference images not displayed]

FINDINGS: Segmentation:  5 lumbar type vertebral bodies assumed.

Alignment: Curvature convex to the right with the apex at L3.
Straightening of the normal lumbar lordosis.

Vertebrae: No fracture or primary bone lesion. Discogenic endplate
changes at L4-5 with mild edema.

Conus medullaris and cauda equina: Conus extends to the L1 level.
Conus and cauda equina appear normal.

Paraspinal and other soft tissues: Negative

Disc levels:

T12-L1: Minimal disc bulge and facet degeneration.  No stenosis.

L1-2: Mild disc bulge and facet hypertrophy.  No stenosis.

L2-3: Mild disc bulge and facet hypertrophy. Somewhat small canal on
a congenital basis. No apparent compressive stenosis.

L3-4: Disc degeneration more pronounced on the left. Endplate
osteophytes and bulging of the disc. Facet degeneration and
hypertrophy worse on the left. Stenosis of the left lateral recess
and the intervertebral foramen on the left that could possibly be
symptomatic.

L4-5: Disc degeneration more pronounced on the right with endplate
osteophytes and circumferential bulging of the disc. Facet and
ligamentous hypertrophy slightly more pronounced on the right.
Severe multifactorial spinal stenosis. Foraminal stenosis right
worse than left. Neural compression could occur on either or both
sides at this level. Right L4 nerve could be compressed in the
foramen.

L5-S1: Endplate osteophytes and bulging of the disc. Bilateral facet
degeneration and hypertrophy. Stenosis of both subarticular lateral
recesses and neural foramina that could cause neural compression on
either or both sides. Left foraminal encroachment by
osteophytes/disc as worsened since the previous study.

Since the study of 5454, the findings are similar, perhaps slightly
worsened in general.
IMPRESSION: Curvature convex to the right. Straightening of the normal lumbar
lordosis.

Progressive degenerative disease in the lower lumbar spine that
could be symptomatic.

L3-4: Left lateral recess and foraminal stenosis that could cause
left-sided neural compression.

L4-5: Severe multifactorial spinal stenosis of the central canal
that could cause neural compression on either or both sides.
Foraminal stenosis worse on the right that could affect the exiting
L4 nerve.

L5-S1: Bilateral subarticular lateral recess and foraminal stenosis
that could cause neural compression on either or both sides. Left
foraminal disease has worsened since the previous study.

## 2020-07-17 DIAGNOSIS — R05 Cough: Secondary | ICD-10-CM | POA: Diagnosis not present

## 2020-07-17 DIAGNOSIS — J019 Acute sinusitis, unspecified: Secondary | ICD-10-CM | POA: Diagnosis not present

## 2020-07-17 DIAGNOSIS — I517 Cardiomegaly: Secondary | ICD-10-CM | POA: Diagnosis not present

## 2020-07-17 DIAGNOSIS — R0602 Shortness of breath: Secondary | ICD-10-CM | POA: Diagnosis not present

## 2020-07-17 DIAGNOSIS — J9811 Atelectasis: Secondary | ICD-10-CM | POA: Diagnosis not present

## 2020-09-16 DIAGNOSIS — G4730 Sleep apnea, unspecified: Secondary | ICD-10-CM | POA: Diagnosis not present

## 2020-09-16 DIAGNOSIS — I428 Other cardiomyopathies: Secondary | ICD-10-CM | POA: Diagnosis not present

## 2020-09-16 DIAGNOSIS — I10 Essential (primary) hypertension: Secondary | ICD-10-CM | POA: Diagnosis not present

## 2020-09-16 DIAGNOSIS — E782 Mixed hyperlipidemia: Secondary | ICD-10-CM | POA: Diagnosis not present

## 2020-09-16 DIAGNOSIS — Z23 Encounter for immunization: Secondary | ICD-10-CM | POA: Diagnosis not present

## 2020-09-16 DIAGNOSIS — R0602 Shortness of breath: Secondary | ICD-10-CM | POA: Diagnosis not present

## 2020-09-16 DIAGNOSIS — I480 Paroxysmal atrial fibrillation: Secondary | ICD-10-CM | POA: Diagnosis not present

## 2020-09-16 DIAGNOSIS — R0600 Dyspnea, unspecified: Secondary | ICD-10-CM | POA: Diagnosis not present

## 2020-09-16 DIAGNOSIS — I700 Atherosclerosis of aorta: Secondary | ICD-10-CM | POA: Diagnosis not present

## 2020-09-19 DIAGNOSIS — R053 Chronic cough: Secondary | ICD-10-CM | POA: Diagnosis not present

## 2020-09-19 DIAGNOSIS — I428 Other cardiomyopathies: Secondary | ICD-10-CM | POA: Diagnosis not present

## 2020-09-19 DIAGNOSIS — J3489 Other specified disorders of nose and nasal sinuses: Secondary | ICD-10-CM | POA: Diagnosis not present

## 2020-09-19 DIAGNOSIS — N183 Chronic kidney disease, stage 3 unspecified: Secondary | ICD-10-CM | POA: Diagnosis not present

## 2020-09-19 DIAGNOSIS — I129 Hypertensive chronic kidney disease with stage 1 through stage 4 chronic kidney disease, or unspecified chronic kidney disease: Secondary | ICD-10-CM | POA: Diagnosis not present

## 2020-09-19 DIAGNOSIS — R059 Cough, unspecified: Secondary | ICD-10-CM | POA: Diagnosis not present

## 2020-10-03 DIAGNOSIS — I129 Hypertensive chronic kidney disease with stage 1 through stage 4 chronic kidney disease, or unspecified chronic kidney disease: Secondary | ICD-10-CM | POA: Diagnosis not present

## 2020-10-03 DIAGNOSIS — J309 Allergic rhinitis, unspecified: Secondary | ICD-10-CM | POA: Diagnosis not present

## 2020-10-03 DIAGNOSIS — N183 Chronic kidney disease, stage 3 unspecified: Secondary | ICD-10-CM | POA: Diagnosis not present

## 2020-10-27 DIAGNOSIS — R0602 Shortness of breath: Secondary | ICD-10-CM | POA: Diagnosis not present

## 2020-10-27 DIAGNOSIS — R001 Bradycardia, unspecified: Secondary | ICD-10-CM | POA: Diagnosis not present

## 2020-10-27 DIAGNOSIS — D649 Anemia, unspecified: Secondary | ICD-10-CM | POA: Diagnosis not present

## 2020-10-27 DIAGNOSIS — I700 Atherosclerosis of aorta: Secondary | ICD-10-CM | POA: Diagnosis not present

## 2020-10-27 DIAGNOSIS — R195 Other fecal abnormalities: Secondary | ICD-10-CM | POA: Diagnosis not present

## 2020-10-27 DIAGNOSIS — I428 Other cardiomyopathies: Secondary | ICD-10-CM | POA: Diagnosis not present

## 2020-10-27 DIAGNOSIS — I10 Essential (primary) hypertension: Secondary | ICD-10-CM | POA: Diagnosis not present

## 2020-11-02 DIAGNOSIS — G4733 Obstructive sleep apnea (adult) (pediatric): Secondary | ICD-10-CM | POA: Diagnosis not present

## 2020-11-07 DIAGNOSIS — R0600 Dyspnea, unspecified: Secondary | ICD-10-CM | POA: Diagnosis not present

## 2020-11-24 DIAGNOSIS — I129 Hypertensive chronic kidney disease with stage 1 through stage 4 chronic kidney disease, or unspecified chronic kidney disease: Secondary | ICD-10-CM | POA: Diagnosis not present

## 2020-11-24 DIAGNOSIS — N183 Chronic kidney disease, stage 3 unspecified: Secondary | ICD-10-CM | POA: Diagnosis not present

## 2020-11-24 DIAGNOSIS — E063 Autoimmune thyroiditis: Secondary | ICD-10-CM | POA: Diagnosis not present

## 2020-11-24 DIAGNOSIS — E038 Other specified hypothyroidism: Secondary | ICD-10-CM | POA: Diagnosis not present

## 2020-11-24 DIAGNOSIS — R7309 Other abnormal glucose: Secondary | ICD-10-CM | POA: Diagnosis not present

## 2020-12-01 DIAGNOSIS — I428 Other cardiomyopathies: Secondary | ICD-10-CM | POA: Diagnosis not present

## 2020-12-01 DIAGNOSIS — E038 Other specified hypothyroidism: Secondary | ICD-10-CM | POA: Diagnosis not present

## 2020-12-01 DIAGNOSIS — Z0000 Encounter for general adult medical examination without abnormal findings: Secondary | ICD-10-CM | POA: Diagnosis not present

## 2020-12-01 DIAGNOSIS — I700 Atherosclerosis of aorta: Secondary | ICD-10-CM | POA: Diagnosis not present

## 2020-12-01 DIAGNOSIS — E063 Autoimmune thyroiditis: Secondary | ICD-10-CM | POA: Diagnosis not present

## 2020-12-01 DIAGNOSIS — Z9989 Dependence on other enabling machines and devices: Secondary | ICD-10-CM | POA: Diagnosis not present

## 2020-12-01 DIAGNOSIS — I129 Hypertensive chronic kidney disease with stage 1 through stage 4 chronic kidney disease, or unspecified chronic kidney disease: Secondary | ICD-10-CM | POA: Diagnosis not present

## 2020-12-01 DIAGNOSIS — R7303 Prediabetes: Secondary | ICD-10-CM | POA: Diagnosis not present

## 2020-12-01 DIAGNOSIS — N183 Chronic kidney disease, stage 3 unspecified: Secondary | ICD-10-CM | POA: Diagnosis not present

## 2020-12-10 DIAGNOSIS — I428 Other cardiomyopathies: Secondary | ICD-10-CM | POA: Diagnosis not present

## 2020-12-10 DIAGNOSIS — I10 Essential (primary) hypertension: Secondary | ICD-10-CM | POA: Diagnosis not present

## 2020-12-10 DIAGNOSIS — E782 Mixed hyperlipidemia: Secondary | ICD-10-CM | POA: Diagnosis not present

## 2020-12-10 DIAGNOSIS — I700 Atherosclerosis of aorta: Secondary | ICD-10-CM | POA: Diagnosis not present

## 2020-12-10 DIAGNOSIS — G4733 Obstructive sleep apnea (adult) (pediatric): Secondary | ICD-10-CM | POA: Diagnosis not present

## 2021-01-29 DIAGNOSIS — J4540 Moderate persistent asthma, uncomplicated: Secondary | ICD-10-CM | POA: Diagnosis not present

## 2021-02-02 DIAGNOSIS — Z03818 Encounter for observation for suspected exposure to other biological agents ruled out: Secondary | ICD-10-CM | POA: Diagnosis not present

## 2021-02-02 DIAGNOSIS — Z20822 Contact with and (suspected) exposure to covid-19: Secondary | ICD-10-CM | POA: Diagnosis not present

## 2021-02-09 DIAGNOSIS — G4733 Obstructive sleep apnea (adult) (pediatric): Secondary | ICD-10-CM | POA: Diagnosis not present

## 2021-02-10 DIAGNOSIS — E785 Hyperlipidemia, unspecified: Secondary | ICD-10-CM | POA: Diagnosis not present

## 2021-02-10 DIAGNOSIS — L299 Pruritus, unspecified: Secondary | ICD-10-CM | POA: Diagnosis not present

## 2021-02-10 DIAGNOSIS — N183 Chronic kidney disease, stage 3 unspecified: Secondary | ICD-10-CM | POA: Diagnosis not present

## 2021-02-10 DIAGNOSIS — I129 Hypertensive chronic kidney disease with stage 1 through stage 4 chronic kidney disease, or unspecified chronic kidney disease: Secondary | ICD-10-CM | POA: Diagnosis not present

## 2021-02-10 DIAGNOSIS — I428 Other cardiomyopathies: Secondary | ICD-10-CM | POA: Diagnosis not present

## 2021-02-10 DIAGNOSIS — E038 Other specified hypothyroidism: Secondary | ICD-10-CM | POA: Diagnosis not present

## 2021-02-10 DIAGNOSIS — R7303 Prediabetes: Secondary | ICD-10-CM | POA: Diagnosis not present

## 2021-02-10 DIAGNOSIS — R251 Tremor, unspecified: Secondary | ICD-10-CM | POA: Diagnosis not present

## 2021-02-10 DIAGNOSIS — I700 Atherosclerosis of aorta: Secondary | ICD-10-CM | POA: Diagnosis not present

## 2021-02-10 DIAGNOSIS — E063 Autoimmune thyroiditis: Secondary | ICD-10-CM | POA: Diagnosis not present

## 2021-02-10 DIAGNOSIS — E1169 Type 2 diabetes mellitus with other specified complication: Secondary | ICD-10-CM | POA: Diagnosis not present

## 2021-03-09 DIAGNOSIS — G4733 Obstructive sleep apnea (adult) (pediatric): Secondary | ICD-10-CM | POA: Diagnosis not present

## 2021-03-17 DIAGNOSIS — I129 Hypertensive chronic kidney disease with stage 1 through stage 4 chronic kidney disease, or unspecified chronic kidney disease: Secondary | ICD-10-CM | POA: Diagnosis not present

## 2021-03-17 DIAGNOSIS — E785 Hyperlipidemia, unspecified: Secondary | ICD-10-CM | POA: Diagnosis not present

## 2021-03-17 DIAGNOSIS — E1169 Type 2 diabetes mellitus with other specified complication: Secondary | ICD-10-CM | POA: Diagnosis not present

## 2021-03-17 DIAGNOSIS — N183 Chronic kidney disease, stage 3 unspecified: Secondary | ICD-10-CM | POA: Diagnosis not present

## 2021-03-17 DIAGNOSIS — I428 Other cardiomyopathies: Secondary | ICD-10-CM | POA: Diagnosis not present

## 2021-04-06 DIAGNOSIS — I428 Other cardiomyopathies: Secondary | ICD-10-CM | POA: Diagnosis not present

## 2021-04-06 DIAGNOSIS — R7303 Prediabetes: Secondary | ICD-10-CM | POA: Diagnosis not present

## 2021-04-06 DIAGNOSIS — E038 Other specified hypothyroidism: Secondary | ICD-10-CM | POA: Diagnosis not present

## 2021-04-06 DIAGNOSIS — E063 Autoimmune thyroiditis: Secondary | ICD-10-CM | POA: Diagnosis not present

## 2021-04-06 DIAGNOSIS — I129 Hypertensive chronic kidney disease with stage 1 through stage 4 chronic kidney disease, or unspecified chronic kidney disease: Secondary | ICD-10-CM | POA: Diagnosis not present

## 2021-04-06 DIAGNOSIS — N183 Chronic kidney disease, stage 3 unspecified: Secondary | ICD-10-CM | POA: Diagnosis not present

## 2021-04-09 DIAGNOSIS — G4733 Obstructive sleep apnea (adult) (pediatric): Secondary | ICD-10-CM | POA: Diagnosis not present

## 2021-04-13 DIAGNOSIS — E063 Autoimmune thyroiditis: Secondary | ICD-10-CM | POA: Diagnosis not present

## 2021-04-13 DIAGNOSIS — I428 Other cardiomyopathies: Secondary | ICD-10-CM | POA: Diagnosis not present

## 2021-04-13 DIAGNOSIS — N183 Chronic kidney disease, stage 3 unspecified: Secondary | ICD-10-CM | POA: Diagnosis not present

## 2021-04-13 DIAGNOSIS — Z0000 Encounter for general adult medical examination without abnormal findings: Secondary | ICD-10-CM | POA: Diagnosis not present

## 2021-04-13 DIAGNOSIS — R7303 Prediabetes: Secondary | ICD-10-CM | POA: Diagnosis not present

## 2021-04-13 DIAGNOSIS — I700 Atherosclerosis of aorta: Secondary | ICD-10-CM | POA: Diagnosis not present

## 2021-04-13 DIAGNOSIS — E038 Other specified hypothyroidism: Secondary | ICD-10-CM | POA: Diagnosis not present

## 2021-05-01 DIAGNOSIS — R0602 Shortness of breath: Secondary | ICD-10-CM | POA: Diagnosis not present

## 2021-05-01 DIAGNOSIS — Z01818 Encounter for other preprocedural examination: Secondary | ICD-10-CM | POA: Diagnosis not present

## 2021-05-09 DIAGNOSIS — G4733 Obstructive sleep apnea (adult) (pediatric): Secondary | ICD-10-CM | POA: Diagnosis not present

## 2021-05-18 ENCOUNTER — Emergency Department: Payer: Medicare HMO

## 2021-05-18 ENCOUNTER — Emergency Department
Admission: EM | Admit: 2021-05-18 | Discharge: 2021-05-18 | Disposition: A | Discharge status: Home / Self Care | Payer: Medicare HMO | Attending: Emergency Medicine | Admitting: Emergency Medicine

## 2021-05-18 ENCOUNTER — Other Ambulatory Visit: Payer: Self-pay

## 2021-05-18 DIAGNOSIS — B029 Zoster without complications: Secondary | ICD-10-CM | POA: Diagnosis not present

## 2021-05-18 DIAGNOSIS — J189 Pneumonia, unspecified organism: Secondary | ICD-10-CM | POA: Diagnosis not present

## 2021-05-18 DIAGNOSIS — Z7982 Long term (current) use of aspirin: Secondary | ICD-10-CM | POA: Insufficient documentation

## 2021-05-18 DIAGNOSIS — I10 Essential (primary) hypertension: Secondary | ICD-10-CM | POA: Diagnosis not present

## 2021-05-18 DIAGNOSIS — R21 Rash and other nonspecific skin eruption: Secondary | ICD-10-CM | POA: Diagnosis not present

## 2021-05-18 MED ORDER — AMOXICILLIN-POT CLAVULANATE 875-125 MG PO TABS: 1.0000 | ORAL_TABLET | Freq: Two times a day (BID) | ORAL | 0 refills | Status: AC

## 2021-05-18 MED ORDER — TRAMADOL HCL 50 MG PO TABS: 50.0000 mg | ORAL_TABLET | Freq: Two times a day (BID) | ORAL | 0 refills | Status: AC | PRN

## 2021-05-18 MED ORDER — AZITHROMYCIN 250 MG PO TABS: ORAL_TABLET | ORAL | 0 refills | Status: AC

## 2021-05-18 MED ORDER — VALACYCLOVIR HCL 1 G PO TABS: 1000.0000 mg | ORAL_TABLET | Freq: Three times a day (TID) | ORAL | 0 refills | Status: AC

## 2021-05-18 NOTE — ED Triage Notes (Signed)
Pt comes with c/o rash noted to body. Pt states he noticed it week ago. Pt states itching.  MD at bedside. MD states shingles present.

## 2021-05-18 NOTE — ED Provider Notes (Signed)
[facility] Emergency Department Provider Note   ____________________________________________    I have reviewed the triage vital signs and the nursing notes.   HISTORY  Chief Complaint Rash     HPI John Andersen is a 87 y.o. male with a history as noted below who presents with complaints of a rash to his back and neck on the right which she describes as painful.  He notes that it started about 3 to 4 days ago.  He has never had this before.  Denies fevers or chills.  No pruritus.  Separately he notes that his breathing has not been as good as typical, he does note chronic breathing difficulties but feels that he may have been wheezing last night Past Medical History:  Diagnosis Date  . Arthritis   . Dizziness   . Dyspnea   . Elevated lipids   . Environmental and seasonal allergies   . GERD (gastroesophageal reflux disease)   . HOH (hard of hearing)   . Hypertension   . Tremors of nervous system     There are no problems to display for this patient.   Past Surgical History:  Procedure Laterality Date  . CATARACT EXTRACTION W/ INTRAOCULAR LENS IMPLANT Right   . CATARACT EXTRACTION W/PHACO Left 06/07/2017   Procedure: CATARACT EXTRACTION PHACO AND INTRAOCULAR LENS PLACEMENT (IOC);  Surgeon: Galen Manila, MD;  Location: [facility] ORS;  Service: Ophthalmology;  Laterality: Left;  [place] 01:09 AP% 11.0 CDE 7.66 fluid pack lot #  . leg laceration Left     Prior to Admission medications   Medication Sig Start Date End Date Taking? Authorizing Provider  amoxicillin-clavulanate (AUGMENTIN) 875-125 MG tablet Take 1 tablet by mouth 2 (two) times daily for 7 days. 05/18/21 05/25/21 Yes Jene Every, MD  azithromycin (ZITHROMAX Z-PAK) 250 MG tablet Take 2 tablets (500 mg) on  Day 1,  followed by 1 tablet (250 mg) once daily on Days 2 through 5. 05/18/21 05/23/21 Yes Jene Every, MD  traMADol (ULTRAM) 50 MG tablet Take 1 tablet (50 mg total) by mouth  every 12 (twelve) hours as needed for severe pain. 05/18/21 05/18/22 Yes Jene Every, MD  valACYclovir (VALTREX) 1000 MG tablet Take 1 tablet (1,000 mg total) by mouth 3 (three) times daily for 7 days. 05/18/21 05/25/21 Yes Jene Every, MD  acetaminophen (TYLENOL ARTHRITIS PAIN) 650 MG CR tablet Take 1,300 mg by mouth 2 (two) times daily.     [provider]  aspirin EC 81 MG tablet Take 81 mg by mouth daily.    [provider]  atorvastatin (LIPITOR) 40 MG tablet Take 40 mg by mouth daily at 6 PM.    [provider]  doxazosin (CARDURA) 8 MG tablet Take 8 mg by mouth at bedtime.    [provider]  esomeprazole (NEXIUM) 40 MG capsule Take 40 mg by mouth every other day.     [provider]  loratadine (CLARITIN) 10 MG tablet Take 10 mg by mouth daily as needed for allergies.    [provider]  Multiple Vitamins-Minerals (CENTRUM SILVER PO) Take 1 capsule by mouth daily.    [provider]  Omega-3 Fatty Acids (FISH OIL) 1000 MG CAPS Take 1 capsule by mouth daily.    [provider]     Allergies Patient has no known allergies.  No family history on file.  Social History Social History   Tobacco Use  . Smoking status: Never Smoker  . Smokeless  tobacco: Never Used  Substance Use Topics  . Alcohol use: No    Review of Systems  Constitutional: No fever/chills Eyes: No visual changes.  ENT: No sore throat. Cardiovascular: Denies chest pain. Respiratory: As Gastrointestinal: No abdominal pain.  No nausea, no vomiting.   Genitourinary: Negative for dysuria. Musculoskeletal: Negative for back pain. Skin: As above Neurological: Negative for headaches or weakness   ____________________________________________   PHYSICAL EXAM:  VITAL SIGNS: ED Triage Vitals [05/18/21 0815]  Enc Vitals Group     BP      Pulse      Resp      Temp      Temp src      SpO2      Weight      Height      Head  Circumference      Peak Flow      Pain Score 10     Pain Loc      Pain Edu?      Excl. in GC?     Constitutional: Alert and oriented.  Eyes: Conjunctivae are normal.  Head: Atraumatic. Nose: No congestion/rhinnorhea. Mouth/Throat: Mucous membranes are moist.   Neck:  Painless ROM Cardiovascular: Normal rate, regular rhythm. Grossly normal heart sounds.  Good peripheral circulation. Respiratory: Normal respiratory effort.  No retractions.  No rales Gastrointestinal: Soft and nontender. No distention.  No CVA tenderness.  Musculoskeletal: No lower extremity tenderness nor edema.  Warm and well perfused Neurologic:  Normal speech and language. No gross focal neurologic deficits are appreciated.  Skin:  Skin is warm, dry and intact.  Shingles rash right upper back extending around the right neck and onto the right shoulder Psychiatric: Mood and affect are normal. Speech and behavior are normal.  ____________________________________________   LABS (all labs ordered are listed, but only abnormal results are displayed)  Labs Reviewed - No data to display ____________________________________________  EKG  None ____________________________________________  RADIOLOGY  Chest x-ray reviewed by me question infiltrate left lung ____________________________________________   PROCEDURES  Procedure(s) performed: No  Procedures   Critical Care performed: No ____________________________________________   INITIAL IMPRESSION / ASSESSMENT AND PLAN / ED COURSE  Pertinent labs & imaging results that were available during my care of the patient were reviewed by me and considered in my medical decision making (see chart for details).  Patient presents with rash as noted above, consistent with shingles, greater than 72 hours will start Valtrex  Chronic breathing difficulties, feels that he may be wheezing slightly, will obtain x-ray  X-ray demonstrates possible opacity of the  lingula, patient is overall well-appearing with normal heart rate, afebrile, normal oxygenation, appropriate for p.o. antibiotics with strict return precautions    ____________________________________________   FINAL CLINICAL IMPRESSION(S) / ED DIAGNOSES  Final diagnoses:  Herpes zoster without complication  Community acquired pneumonia, unspecified laterality        Note:  This document was prepared using Dragon voice recognition software and may include unintentional dictation errors.   Jene Every, MD 05/18/21 [phone]

## 2021-05-22 ENCOUNTER — Encounter: Payer: Self-pay | Admitting: *Deleted

## 2021-05-22 ENCOUNTER — Other Ambulatory Visit: Payer: Self-pay

## 2021-05-22 ENCOUNTER — Encounter: Payer: Medicare HMO | Attending: Pulmonary Disease | Admitting: *Deleted

## 2021-05-22 DIAGNOSIS — I5022 Chronic systolic (congestive) heart failure: Secondary | ICD-10-CM

## 2021-05-22 NOTE — Progress Notes (Signed)
Virtual orientation call completed today. he has an appointment on Date: 06/04/2021 for EP eval and gym Orientation.  Documentation of diagnosis can be found in [facility]  Date: 05/01/2021.

## 2021-05-26 DIAGNOSIS — B029 Zoster without complications: Secondary | ICD-10-CM | POA: Diagnosis not present

## 2021-05-26 DIAGNOSIS — J189 Pneumonia, unspecified organism: Secondary | ICD-10-CM | POA: Diagnosis not present

## 2021-06-01 ENCOUNTER — Ambulatory Visit: Payer: Medicare HMO

## 2021-06-04 ENCOUNTER — Other Ambulatory Visit: Payer: Self-pay

## 2021-06-09 DIAGNOSIS — G4733 Obstructive sleep apnea (adult) (pediatric): Secondary | ICD-10-CM | POA: Diagnosis not present

## 2021-06-19 DIAGNOSIS — H35373 Puckering of macula, bilateral: Secondary | ICD-10-CM | POA: Diagnosis not present

## 2021-06-19 DIAGNOSIS — Z0100 Encounter for examination of eyes and vision without abnormal findings: Secondary | ICD-10-CM | POA: Diagnosis not present

## 2021-07-09 DIAGNOSIS — G4733 Obstructive sleep apnea (adult) (pediatric): Secondary | ICD-10-CM | POA: Diagnosis not present

## 2021-07-16 DIAGNOSIS — E1169 Type 2 diabetes mellitus with other specified complication: Secondary | ICD-10-CM | POA: Diagnosis not present

## 2021-07-16 DIAGNOSIS — E785 Hyperlipidemia, unspecified: Secondary | ICD-10-CM | POA: Diagnosis not present

## 2021-07-16 DIAGNOSIS — R0602 Shortness of breath: Secondary | ICD-10-CM | POA: Diagnosis not present

## 2021-07-16 DIAGNOSIS — N183 Chronic kidney disease, stage 3 unspecified: Secondary | ICD-10-CM | POA: Diagnosis not present

## 2021-07-16 DIAGNOSIS — G4733 Obstructive sleep apnea (adult) (pediatric): Secondary | ICD-10-CM | POA: Diagnosis not present

## 2021-07-16 DIAGNOSIS — I428 Other cardiomyopathies: Secondary | ICD-10-CM | POA: Diagnosis not present

## 2021-07-16 DIAGNOSIS — I700 Atherosclerosis of aorta: Secondary | ICD-10-CM | POA: Diagnosis not present

## 2021-07-16 DIAGNOSIS — I129 Hypertensive chronic kidney disease with stage 1 through stage 4 chronic kidney disease, or unspecified chronic kidney disease: Secondary | ICD-10-CM | POA: Diagnosis not present

## 2021-08-05 DIAGNOSIS — I428 Other cardiomyopathies: Secondary | ICD-10-CM | POA: Diagnosis not present

## 2021-08-05 DIAGNOSIS — R7303 Prediabetes: Secondary | ICD-10-CM | POA: Diagnosis not present

## 2021-08-05 DIAGNOSIS — R0602 Shortness of breath: Secondary | ICD-10-CM | POA: Diagnosis not present

## 2021-08-05 DIAGNOSIS — E063 Autoimmune thyroiditis: Secondary | ICD-10-CM | POA: Diagnosis not present

## 2021-08-05 DIAGNOSIS — E038 Other specified hypothyroidism: Secondary | ICD-10-CM | POA: Diagnosis not present

## 2021-08-09 DIAGNOSIS — G4733 Obstructive sleep apnea (adult) (pediatric): Secondary | ICD-10-CM | POA: Diagnosis not present

## 2021-08-12 DIAGNOSIS — E1169 Type 2 diabetes mellitus with other specified complication: Secondary | ICD-10-CM | POA: Diagnosis not present

## 2021-08-12 DIAGNOSIS — E038 Other specified hypothyroidism: Secondary | ICD-10-CM | POA: Diagnosis not present

## 2021-08-12 DIAGNOSIS — E785 Hyperlipidemia, unspecified: Secondary | ICD-10-CM | POA: Diagnosis not present

## 2021-08-12 DIAGNOSIS — I700 Atherosclerosis of aorta: Secondary | ICD-10-CM | POA: Diagnosis not present

## 2021-08-12 DIAGNOSIS — E063 Autoimmune thyroiditis: Secondary | ICD-10-CM | POA: Diagnosis not present

## 2021-08-12 DIAGNOSIS — I428 Other cardiomyopathies: Secondary | ICD-10-CM | POA: Diagnosis not present

## 2021-08-12 DIAGNOSIS — I129 Hypertensive chronic kidney disease with stage 1 through stage 4 chronic kidney disease, or unspecified chronic kidney disease: Secondary | ICD-10-CM | POA: Diagnosis not present

## 2021-08-12 DIAGNOSIS — N183 Chronic kidney disease, stage 3 unspecified: Secondary | ICD-10-CM | POA: Diagnosis not present

## 2021-08-12 DIAGNOSIS — E1122 Type 2 diabetes mellitus with diabetic chronic kidney disease: Secondary | ICD-10-CM | POA: Diagnosis not present

## 2021-09-09 DIAGNOSIS — G4733 Obstructive sleep apnea (adult) (pediatric): Secondary | ICD-10-CM | POA: Diagnosis not present

## 2021-09-14 DIAGNOSIS — G4733 Obstructive sleep apnea (adult) (pediatric): Secondary | ICD-10-CM | POA: Diagnosis not present

## 2021-09-14 DIAGNOSIS — N183 Chronic kidney disease, stage 3 unspecified: Secondary | ICD-10-CM | POA: Diagnosis not present

## 2021-09-14 DIAGNOSIS — I700 Atherosclerosis of aorta: Secondary | ICD-10-CM | POA: Diagnosis not present

## 2021-09-14 DIAGNOSIS — E1169 Type 2 diabetes mellitus with other specified complication: Secondary | ICD-10-CM | POA: Diagnosis not present

## 2021-09-14 DIAGNOSIS — R0602 Shortness of breath: Secondary | ICD-10-CM | POA: Diagnosis not present

## 2021-09-14 DIAGNOSIS — I129 Hypertensive chronic kidney disease with stage 1 through stage 4 chronic kidney disease, or unspecified chronic kidney disease: Secondary | ICD-10-CM | POA: Diagnosis not present

## 2021-09-14 DIAGNOSIS — E785 Hyperlipidemia, unspecified: Secondary | ICD-10-CM | POA: Diagnosis not present

## 2021-10-09 DIAGNOSIS — G4733 Obstructive sleep apnea (adult) (pediatric): Secondary | ICD-10-CM | POA: Diagnosis not present

## 2021-11-03 DIAGNOSIS — G4733 Obstructive sleep apnea (adult) (pediatric): Secondary | ICD-10-CM | POA: Diagnosis not present

## 2021-11-03 DIAGNOSIS — Z9989 Dependence on other enabling machines and devices: Secondary | ICD-10-CM | POA: Diagnosis not present

## 2021-11-30 DIAGNOSIS — J1282 Pneumonia due to coronavirus disease 2019: Secondary | ICD-10-CM | POA: Diagnosis not present

## 2021-11-30 DIAGNOSIS — I5022 Chronic systolic (congestive) heart failure: Secondary | ICD-10-CM | POA: Diagnosis not present

## 2021-11-30 DIAGNOSIS — R55 Syncope and collapse: Secondary | ICD-10-CM | POA: Diagnosis not present

## 2021-11-30 DIAGNOSIS — I248 Other forms of acute ischemic heart disease: Secondary | ICD-10-CM | POA: Diagnosis not present

## 2021-11-30 DIAGNOSIS — I10 Essential (primary) hypertension: Secondary | ICD-10-CM | POA: Diagnosis not present

## 2021-11-30 DIAGNOSIS — R0989 Other specified symptoms and signs involving the circulatory and respiratory systems: Secondary | ICD-10-CM | POA: Diagnosis not present

## 2021-11-30 DIAGNOSIS — U071 COVID-19: Secondary | ICD-10-CM | POA: Diagnosis not present

## 2021-11-30 DIAGNOSIS — R531 Weakness: Secondary | ICD-10-CM | POA: Diagnosis not present

## 2021-11-30 DIAGNOSIS — I129 Hypertensive chronic kidney disease with stage 1 through stage 4 chronic kidney disease, or unspecified chronic kidney disease: Secondary | ICD-10-CM | POA: Diagnosis not present

## 2021-11-30 DIAGNOSIS — I21A1 Myocardial infarction type 2: Secondary | ICD-10-CM | POA: Diagnosis not present

## 2021-11-30 DIAGNOSIS — S0990XA Unspecified injury of head, initial encounter: Secondary | ICD-10-CM | POA: Diagnosis not present

## 2021-11-30 DIAGNOSIS — N400 Enlarged prostate without lower urinary tract symptoms: Secondary | ICD-10-CM | POA: Diagnosis not present

## 2021-11-30 DIAGNOSIS — G252 Other specified forms of tremor: Secondary | ICD-10-CM | POA: Diagnosis not present

## 2021-11-30 DIAGNOSIS — R059 Cough, unspecified: Secondary | ICD-10-CM | POA: Diagnosis not present

## 2021-11-30 DIAGNOSIS — E8770 Fluid overload, unspecified: Secondary | ICD-10-CM | POA: Diagnosis not present

## 2021-11-30 DIAGNOSIS — E785 Hyperlipidemia, unspecified: Secondary | ICD-10-CM | POA: Diagnosis not present

## 2021-11-30 DIAGNOSIS — D6859 Other primary thrombophilia: Secondary | ICD-10-CM | POA: Diagnosis not present

## 2021-11-30 DIAGNOSIS — E039 Hypothyroidism, unspecified: Secondary | ICD-10-CM | POA: Diagnosis not present

## 2021-11-30 DIAGNOSIS — R0981 Nasal congestion: Secondary | ICD-10-CM | POA: Diagnosis not present

## 2021-11-30 DIAGNOSIS — R7303 Prediabetes: Secondary | ICD-10-CM | POA: Diagnosis not present

## 2021-11-30 DIAGNOSIS — I4891 Unspecified atrial fibrillation: Secondary | ICD-10-CM | POA: Diagnosis not present

## 2021-11-30 DIAGNOSIS — D688 Other specified coagulation defects: Secondary | ICD-10-CM | POA: Diagnosis not present

## 2021-11-30 DIAGNOSIS — N183 Chronic kidney disease, stage 3 unspecified: Secondary | ICD-10-CM | POA: Diagnosis not present

## 2021-11-30 DIAGNOSIS — I252 Old myocardial infarction: Secondary | ICD-10-CM | POA: Diagnosis not present

## 2021-11-30 DIAGNOSIS — I130 Hypertensive heart and chronic kidney disease with heart failure and stage 1 through stage 4 chronic kidney disease, or unspecified chronic kidney disease: Secondary | ICD-10-CM | POA: Diagnosis not present

## 2021-11-30 DIAGNOSIS — R778 Other specified abnormalities of plasma proteins: Secondary | ICD-10-CM | POA: Diagnosis not present

## 2021-12-10 DIAGNOSIS — I428 Other cardiomyopathies: Secondary | ICD-10-CM | POA: Diagnosis not present

## 2021-12-10 DIAGNOSIS — U071 COVID-19: Secondary | ICD-10-CM | POA: Diagnosis not present

## 2021-12-31 DIAGNOSIS — I10 Essential (primary) hypertension: Secondary | ICD-10-CM | POA: Diagnosis not present

## 2021-12-31 DIAGNOSIS — I491 Atrial premature depolarization: Secondary | ICD-10-CM | POA: Diagnosis not present

## 2021-12-31 DIAGNOSIS — I21A1 Myocardial infarction type 2: Secondary | ICD-10-CM | POA: Diagnosis not present

## 2022-02-03 DIAGNOSIS — I5022 Chronic systolic (congestive) heart failure: Secondary | ICD-10-CM | POA: Diagnosis not present

## 2022-02-08 DIAGNOSIS — E063 Autoimmune thyroiditis: Secondary | ICD-10-CM | POA: Diagnosis not present

## 2022-02-08 DIAGNOSIS — E038 Other specified hypothyroidism: Secondary | ICD-10-CM | POA: Diagnosis not present

## 2022-02-08 DIAGNOSIS — N183 Chronic kidney disease, stage 3 unspecified: Secondary | ICD-10-CM | POA: Diagnosis not present

## 2022-02-08 DIAGNOSIS — R7303 Prediabetes: Secondary | ICD-10-CM | POA: Diagnosis not present

## 2022-02-08 DIAGNOSIS — I129 Hypertensive chronic kidney disease with stage 1 through stage 4 chronic kidney disease, or unspecified chronic kidney disease: Secondary | ICD-10-CM | POA: Diagnosis not present

## 2022-02-08 DIAGNOSIS — I700 Atherosclerosis of aorta: Secondary | ICD-10-CM | POA: Diagnosis not present

## 2022-02-15 DIAGNOSIS — E038 Other specified hypothyroidism: Secondary | ICD-10-CM | POA: Diagnosis not present

## 2022-02-15 DIAGNOSIS — I428 Other cardiomyopathies: Secondary | ICD-10-CM | POA: Diagnosis not present

## 2022-02-15 DIAGNOSIS — N183 Chronic kidney disease, stage 3 unspecified: Secondary | ICD-10-CM | POA: Diagnosis not present

## 2022-02-15 DIAGNOSIS — I700 Atherosclerosis of aorta: Secondary | ICD-10-CM | POA: Diagnosis not present

## 2022-02-15 DIAGNOSIS — R7303 Prediabetes: Secondary | ICD-10-CM | POA: Diagnosis not present

## 2022-02-15 DIAGNOSIS — M25562 Pain in left knee: Secondary | ICD-10-CM | POA: Diagnosis not present

## 2022-02-15 DIAGNOSIS — I129 Hypertensive chronic kidney disease with stage 1 through stage 4 chronic kidney disease, or unspecified chronic kidney disease: Secondary | ICD-10-CM | POA: Diagnosis not present

## 2022-02-15 DIAGNOSIS — E063 Autoimmune thyroiditis: Secondary | ICD-10-CM | POA: Diagnosis not present

## 2022-02-15 DIAGNOSIS — E7800 Pure hypercholesterolemia, unspecified: Secondary | ICD-10-CM | POA: Diagnosis not present

## 2022-02-24 DIAGNOSIS — E1169 Type 2 diabetes mellitus with other specified complication: Secondary | ICD-10-CM | POA: Diagnosis not present

## 2022-02-24 DIAGNOSIS — M25562 Pain in left knee: Secondary | ICD-10-CM | POA: Diagnosis not present

## 2022-02-24 DIAGNOSIS — M170 Bilateral primary osteoarthritis of knee: Secondary | ICD-10-CM | POA: Diagnosis not present

## 2022-02-24 DIAGNOSIS — E785 Hyperlipidemia, unspecified: Secondary | ICD-10-CM | POA: Diagnosis not present

## 2022-04-01 DIAGNOSIS — K644 Residual hemorrhoidal skin tags: Secondary | ICD-10-CM | POA: Diagnosis not present

## 2022-04-07 DIAGNOSIS — M1712 Unilateral primary osteoarthritis, left knee: Secondary | ICD-10-CM | POA: Diagnosis not present

## 2022-04-28 DIAGNOSIS — M1712 Unilateral primary osteoarthritis, left knee: Secondary | ICD-10-CM | POA: Diagnosis not present

## 2022-05-19 DIAGNOSIS — M1712 Unilateral primary osteoarthritis, left knee: Secondary | ICD-10-CM | POA: Diagnosis not present

## 2022-05-31 DIAGNOSIS — R5381 Other malaise: Secondary | ICD-10-CM | POA: Diagnosis not present

## 2022-05-31 DIAGNOSIS — G4733 Obstructive sleep apnea (adult) (pediatric): Secondary | ICD-10-CM | POA: Diagnosis not present

## 2022-06-04 DIAGNOSIS — M1712 Unilateral primary osteoarthritis, left knee: Secondary | ICD-10-CM | POA: Diagnosis not present

## 2022-06-14 DIAGNOSIS — I129 Hypertensive chronic kidney disease with stage 1 through stage 4 chronic kidney disease, or unspecified chronic kidney disease: Secondary | ICD-10-CM | POA: Diagnosis not present

## 2022-06-14 DIAGNOSIS — E1122 Type 2 diabetes mellitus with diabetic chronic kidney disease: Secondary | ICD-10-CM | POA: Diagnosis not present

## 2022-06-14 DIAGNOSIS — E7800 Pure hypercholesterolemia, unspecified: Secondary | ICD-10-CM | POA: Diagnosis not present

## 2022-06-14 DIAGNOSIS — G4733 Obstructive sleep apnea (adult) (pediatric): Secondary | ICD-10-CM | POA: Diagnosis not present

## 2022-06-14 DIAGNOSIS — I428 Other cardiomyopathies: Secondary | ICD-10-CM | POA: Diagnosis not present

## 2022-06-14 DIAGNOSIS — I5022 Chronic systolic (congestive) heart failure: Secondary | ICD-10-CM | POA: Diagnosis not present

## 2022-06-14 DIAGNOSIS — I700 Atherosclerosis of aorta: Secondary | ICD-10-CM | POA: Diagnosis not present

## 2022-06-14 DIAGNOSIS — I130 Hypertensive heart and chronic kidney disease with heart failure and stage 1 through stage 4 chronic kidney disease, or unspecified chronic kidney disease: Secondary | ICD-10-CM | POA: Diagnosis not present

## 2022-06-14 DIAGNOSIS — D631 Anemia in chronic kidney disease: Secondary | ICD-10-CM | POA: Diagnosis not present

## 2022-06-14 DIAGNOSIS — N183 Chronic kidney disease, stage 3 unspecified: Secondary | ICD-10-CM | POA: Diagnosis not present

## 2022-07-02 DIAGNOSIS — H35373 Puckering of macula, bilateral: Secondary | ICD-10-CM | POA: Diagnosis not present

## 2022-07-02 DIAGNOSIS — H524 Presbyopia: Secondary | ICD-10-CM | POA: Diagnosis not present

## 2022-07-05 DIAGNOSIS — M1712 Unilateral primary osteoarthritis, left knee: Secondary | ICD-10-CM | POA: Diagnosis not present

## 2022-08-09 DIAGNOSIS — I129 Hypertensive chronic kidney disease with stage 1 through stage 4 chronic kidney disease, or unspecified chronic kidney disease: Secondary | ICD-10-CM | POA: Diagnosis not present

## 2022-08-09 DIAGNOSIS — E063 Autoimmune thyroiditis: Secondary | ICD-10-CM | POA: Diagnosis not present

## 2022-08-09 DIAGNOSIS — E7800 Pure hypercholesterolemia, unspecified: Secondary | ICD-10-CM | POA: Diagnosis not present

## 2022-08-09 DIAGNOSIS — N183 Chronic kidney disease, stage 3 unspecified: Secondary | ICD-10-CM | POA: Diagnosis not present

## 2022-08-09 DIAGNOSIS — E038 Other specified hypothyroidism: Secondary | ICD-10-CM | POA: Diagnosis not present

## 2022-08-09 DIAGNOSIS — R7303 Prediabetes: Secondary | ICD-10-CM | POA: Diagnosis not present

## 2022-08-16 DIAGNOSIS — I129 Hypertensive chronic kidney disease with stage 1 through stage 4 chronic kidney disease, or unspecified chronic kidney disease: Secondary | ICD-10-CM | POA: Diagnosis not present

## 2022-08-16 DIAGNOSIS — R7303 Prediabetes: Secondary | ICD-10-CM | POA: Diagnosis not present

## 2022-08-16 DIAGNOSIS — I700 Atherosclerosis of aorta: Secondary | ICD-10-CM | POA: Diagnosis not present

## 2022-08-16 DIAGNOSIS — E063 Autoimmune thyroiditis: Secondary | ICD-10-CM | POA: Diagnosis not present

## 2022-08-16 DIAGNOSIS — Z0000 Encounter for general adult medical examination without abnormal findings: Secondary | ICD-10-CM | POA: Diagnosis not present

## 2022-08-16 DIAGNOSIS — E038 Other specified hypothyroidism: Secondary | ICD-10-CM | POA: Diagnosis not present

## 2022-08-16 DIAGNOSIS — N183 Chronic kidney disease, stage 3 unspecified: Secondary | ICD-10-CM | POA: Diagnosis not present

## 2022-08-16 DIAGNOSIS — Z9989 Dependence on other enabling machines and devices: Secondary | ICD-10-CM | POA: Diagnosis not present

## 2022-08-16 DIAGNOSIS — I428 Other cardiomyopathies: Secondary | ICD-10-CM | POA: Diagnosis not present

## 2022-09-28 DIAGNOSIS — I5040 Unspecified combined systolic (congestive) and diastolic (congestive) heart failure: Secondary | ICD-10-CM | POA: Diagnosis not present

## 2022-10-12 DIAGNOSIS — M1712 Unilateral primary osteoarthritis, left knee: Secondary | ICD-10-CM | POA: Diagnosis not present

## 2022-12-31 DIAGNOSIS — M1712 Unilateral primary osteoarthritis, left knee: Secondary | ICD-10-CM | POA: Diagnosis not present

## 2023-01-05 DIAGNOSIS — R7309 Other abnormal glucose: Secondary | ICD-10-CM | POA: Diagnosis not present

## 2023-01-05 DIAGNOSIS — I129 Hypertensive chronic kidney disease with stage 1 through stage 4 chronic kidney disease, or unspecified chronic kidney disease: Secondary | ICD-10-CM | POA: Diagnosis not present

## 2023-01-05 DIAGNOSIS — M1712 Unilateral primary osteoarthritis, left knee: Secondary | ICD-10-CM | POA: Diagnosis not present

## 2023-01-05 DIAGNOSIS — M48061 Spinal stenosis, lumbar region without neurogenic claudication: Secondary | ICD-10-CM | POA: Diagnosis not present

## 2023-01-05 DIAGNOSIS — I428 Other cardiomyopathies: Secondary | ICD-10-CM | POA: Diagnosis not present

## 2023-01-05 DIAGNOSIS — Z9989 Dependence on other enabling machines and devices: Secondary | ICD-10-CM | POA: Diagnosis not present

## 2023-01-05 DIAGNOSIS — N183 Chronic kidney disease, stage 3 unspecified: Secondary | ICD-10-CM | POA: Diagnosis not present

## 2023-01-12 DIAGNOSIS — M1712 Unilateral primary osteoarthritis, left knee: Secondary | ICD-10-CM | POA: Diagnosis not present

## 2023-01-22 DIAGNOSIS — G629 Polyneuropathy, unspecified: Secondary | ICD-10-CM | POA: Diagnosis not present

## 2023-01-22 DIAGNOSIS — I2510 Atherosclerotic heart disease of native coronary artery without angina pectoris: Secondary | ICD-10-CM | POA: Diagnosis not present

## 2023-01-22 DIAGNOSIS — G309 Alzheimer's disease, unspecified: Secondary | ICD-10-CM | POA: Diagnosis not present

## 2023-01-22 DIAGNOSIS — I252 Old myocardial infarction: Secondary | ICD-10-CM | POA: Diagnosis not present

## 2023-01-22 DIAGNOSIS — Z818 Family history of other mental and behavioral disorders: Secondary | ICD-10-CM | POA: Diagnosis not present

## 2023-01-22 DIAGNOSIS — I130 Hypertensive heart and chronic kidney disease with heart failure and stage 1 through stage 4 chronic kidney disease, or unspecified chronic kidney disease: Secondary | ICD-10-CM | POA: Diagnosis not present

## 2023-01-22 DIAGNOSIS — I700 Atherosclerosis of aorta: Secondary | ICD-10-CM | POA: Diagnosis not present

## 2023-01-22 DIAGNOSIS — E785 Hyperlipidemia, unspecified: Secondary | ICD-10-CM | POA: Diagnosis not present

## 2023-01-22 DIAGNOSIS — Z809 Family history of malignant neoplasm, unspecified: Secondary | ICD-10-CM | POA: Diagnosis not present

## 2023-01-22 DIAGNOSIS — K219 Gastro-esophageal reflux disease without esophagitis: Secondary | ICD-10-CM | POA: Diagnosis not present

## 2023-01-22 DIAGNOSIS — N529 Male erectile dysfunction, unspecified: Secondary | ICD-10-CM | POA: Diagnosis not present

## 2023-01-22 DIAGNOSIS — J309 Allergic rhinitis, unspecified: Secondary | ICD-10-CM | POA: Diagnosis not present

## 2023-01-22 DIAGNOSIS — I429 Cardiomyopathy, unspecified: Secondary | ICD-10-CM | POA: Diagnosis not present

## 2023-01-22 DIAGNOSIS — E039 Hypothyroidism, unspecified: Secondary | ICD-10-CM | POA: Diagnosis not present

## 2023-01-22 DIAGNOSIS — R001 Bradycardia, unspecified: Secondary | ICD-10-CM | POA: Diagnosis not present

## 2023-01-22 DIAGNOSIS — M1990 Unspecified osteoarthritis, unspecified site: Secondary | ICD-10-CM | POA: Diagnosis not present

## 2023-01-22 DIAGNOSIS — F0280 Dementia in other diseases classified elsewhere without behavioral disturbance: Secondary | ICD-10-CM | POA: Diagnosis not present

## 2023-01-22 DIAGNOSIS — N400 Enlarged prostate without lower urinary tract symptoms: Secondary | ICD-10-CM | POA: Diagnosis not present

## 2023-02-09 DIAGNOSIS — R7303 Prediabetes: Secondary | ICD-10-CM | POA: Diagnosis not present

## 2023-02-09 DIAGNOSIS — E038 Other specified hypothyroidism: Secondary | ICD-10-CM | POA: Diagnosis not present

## 2023-02-09 DIAGNOSIS — I428 Other cardiomyopathies: Secondary | ICD-10-CM | POA: Diagnosis not present

## 2023-02-09 DIAGNOSIS — E063 Autoimmune thyroiditis: Secondary | ICD-10-CM | POA: Diagnosis not present

## 2023-02-11 DIAGNOSIS — I700 Atherosclerosis of aorta: Secondary | ICD-10-CM | POA: Diagnosis not present

## 2023-02-11 DIAGNOSIS — M1712 Unilateral primary osteoarthritis, left knee: Secondary | ICD-10-CM | POA: Diagnosis not present

## 2023-02-11 DIAGNOSIS — E1169 Type 2 diabetes mellitus with other specified complication: Secondary | ICD-10-CM | POA: Diagnosis not present

## 2023-02-11 DIAGNOSIS — E785 Hyperlipidemia, unspecified: Secondary | ICD-10-CM | POA: Diagnosis not present

## 2023-02-16 DIAGNOSIS — R7303 Prediabetes: Secondary | ICD-10-CM | POA: Diagnosis not present

## 2023-02-16 DIAGNOSIS — I700 Atherosclerosis of aorta: Secondary | ICD-10-CM | POA: Diagnosis not present

## 2023-02-16 DIAGNOSIS — I129 Hypertensive chronic kidney disease with stage 1 through stage 4 chronic kidney disease, or unspecified chronic kidney disease: Secondary | ICD-10-CM | POA: Diagnosis not present

## 2023-02-16 DIAGNOSIS — N183 Chronic kidney disease, stage 3 unspecified: Secondary | ICD-10-CM | POA: Diagnosis not present

## 2023-02-16 DIAGNOSIS — E7800 Pure hypercholesterolemia, unspecified: Secondary | ICD-10-CM | POA: Diagnosis not present

## 2023-02-16 DIAGNOSIS — E038 Other specified hypothyroidism: Secondary | ICD-10-CM | POA: Diagnosis not present

## 2023-02-16 DIAGNOSIS — E063 Autoimmune thyroiditis: Secondary | ICD-10-CM | POA: Diagnosis not present

## 2023-04-05 DIAGNOSIS — J4541 Moderate persistent asthma with (acute) exacerbation: Secondary | ICD-10-CM | POA: Diagnosis not present

## 2023-04-05 DIAGNOSIS — J4540 Moderate persistent asthma, uncomplicated: Secondary | ICD-10-CM | POA: Diagnosis not present

## 2023-06-15 DIAGNOSIS — R1031 Right lower quadrant pain: Secondary | ICD-10-CM | POA: Diagnosis not present

## 2023-06-15 DIAGNOSIS — M62830 Muscle spasm of back: Secondary | ICD-10-CM | POA: Diagnosis not present

## 2023-06-21 DIAGNOSIS — N183 Chronic kidney disease, stage 3 unspecified: Secondary | ICD-10-CM | POA: Diagnosis not present

## 2023-06-21 DIAGNOSIS — I129 Hypertensive chronic kidney disease with stage 1 through stage 4 chronic kidney disease, or unspecified chronic kidney disease: Secondary | ICD-10-CM | POA: Diagnosis not present

## 2023-06-21 DIAGNOSIS — R059 Cough, unspecified: Secondary | ICD-10-CM | POA: Diagnosis not present

## 2023-06-21 DIAGNOSIS — R058 Other specified cough: Secondary | ICD-10-CM | POA: Diagnosis not present

## 2023-06-21 DIAGNOSIS — M4854XA Collapsed vertebra, not elsewhere classified, thoracic region, initial encounter for fracture: Secondary | ICD-10-CM | POA: Diagnosis not present

## 2023-06-21 DIAGNOSIS — N184 Chronic kidney disease, stage 4 (severe): Secondary | ICD-10-CM | POA: Diagnosis not present

## 2023-06-21 DIAGNOSIS — R1031 Right lower quadrant pain: Secondary | ICD-10-CM | POA: Diagnosis not present

## 2023-06-21 DIAGNOSIS — M4856XA Collapsed vertebra, not elsewhere classified, lumbar region, initial encounter for fracture: Secondary | ICD-10-CM | POA: Diagnosis not present

## 2023-06-21 DIAGNOSIS — M8580 Other specified disorders of bone density and structure, unspecified site: Secondary | ICD-10-CM | POA: Diagnosis not present

## 2023-06-21 DIAGNOSIS — I700 Atherosclerosis of aorta: Secondary | ICD-10-CM | POA: Diagnosis not present

## 2023-06-24 DIAGNOSIS — E7800 Pure hypercholesterolemia, unspecified: Secondary | ICD-10-CM | POA: Diagnosis not present

## 2023-06-28 DIAGNOSIS — I428 Other cardiomyopathies: Secondary | ICD-10-CM | POA: Diagnosis not present

## 2023-06-28 DIAGNOSIS — R609 Edema, unspecified: Secondary | ICD-10-CM | POA: Diagnosis not present

## 2023-06-28 DIAGNOSIS — S22070D Wedge compression fracture of T9-T10 vertebra, subsequent encounter for fracture with routine healing: Secondary | ICD-10-CM | POA: Diagnosis not present

## 2023-06-28 DIAGNOSIS — J40 Bronchitis, not specified as acute or chronic: Secondary | ICD-10-CM | POA: Diagnosis not present

## 2023-06-30 ENCOUNTER — Encounter: Payer: Self-pay | Admitting: Dermatology

## 2023-06-30 ENCOUNTER — Ambulatory Visit: Payer: Medicare HMO | Admitting: Dermatology

## 2023-06-30 ENCOUNTER — Other Ambulatory Visit
Admission: RE | Admit: 2023-06-30 | Discharge: 2023-06-30 | Disposition: A | Discharge status: Home / Self Care | Payer: Medicare HMO | Source: Ambulatory Visit | Attending: Nurse Practitioner | Admitting: Nurse Practitioner

## 2023-06-30 DIAGNOSIS — L821 Other seborrheic keratosis: Secondary | ICD-10-CM | POA: Diagnosis not present

## 2023-06-30 DIAGNOSIS — Z8679 Personal history of other diseases of the circulatory system: Secondary | ICD-10-CM | POA: Diagnosis not present

## 2023-06-30 DIAGNOSIS — D1801 Hemangioma of skin and subcutaneous tissue: Secondary | ICD-10-CM

## 2023-06-30 DIAGNOSIS — D229 Melanocytic nevi, unspecified: Secondary | ICD-10-CM

## 2023-06-30 DIAGNOSIS — L820 Inflamed seborrheic keratosis: Secondary | ICD-10-CM | POA: Diagnosis not present

## 2023-06-30 DIAGNOSIS — I700 Atherosclerosis of aorta: Secondary | ICD-10-CM | POA: Diagnosis not present

## 2023-06-30 DIAGNOSIS — W908XXA Exposure to other nonionizing radiation, initial encounter: Secondary | ICD-10-CM | POA: Diagnosis not present

## 2023-06-30 DIAGNOSIS — L814 Other melanin hyperpigmentation: Secondary | ICD-10-CM | POA: Diagnosis not present

## 2023-06-30 DIAGNOSIS — R600 Localized edema: Secondary | ICD-10-CM | POA: Diagnosis not present

## 2023-06-30 DIAGNOSIS — Z1283 Encounter for screening for malignant neoplasm of skin: Secondary | ICD-10-CM

## 2023-06-30 DIAGNOSIS — G4733 Obstructive sleep apnea (adult) (pediatric): Secondary | ICD-10-CM | POA: Diagnosis not present

## 2023-06-30 DIAGNOSIS — R0609 Other forms of dyspnea: Secondary | ICD-10-CM | POA: Diagnosis not present

## 2023-06-30 DIAGNOSIS — E7800 Pure hypercholesterolemia, unspecified: Secondary | ICD-10-CM | POA: Diagnosis not present

## 2023-06-30 DIAGNOSIS — L578 Other skin changes due to chronic exposure to nonionizing radiation: Secondary | ICD-10-CM | POA: Diagnosis not present

## 2023-06-30 DIAGNOSIS — I428 Other cardiomyopathies: Secondary | ICD-10-CM | POA: Insufficient documentation

## 2023-06-30 DIAGNOSIS — I129 Hypertensive chronic kidney disease with stage 1 through stage 4 chronic kidney disease, or unspecified chronic kidney disease: Secondary | ICD-10-CM | POA: Diagnosis not present

## 2023-06-30 DIAGNOSIS — I5022 Chronic systolic (congestive) heart failure: Secondary | ICD-10-CM | POA: Diagnosis not present

## 2023-06-30 LAB — BRAIN NATRIURETIC PEPTIDE: B Natriuretic Peptide: 550 pg/mL — ABNORMAL HIGH (ref 0.0–100.0)

## 2023-06-30 NOTE — Patient Instructions (Addendum)
Cryotherapy Aftercare  Wash gently with soap and water everyday.   Apply Vaseline Jelly daily until healed.   Recommend daily broad spectrum sunscreen SPF 30+ to sun-exposed areas, reapply every 2 hours as needed. Call for new or changing lesions.  Staying in the shade or wearing long sleeves, sun glasses (UVA+UVB protection) and wide brim hats (4-inch brim around the entire circumference of the hat) are also recommended for sun protection.   Moisturize daily with CeraVe cream.    Recommend OTC  Sarna lotion (Original- menthol + camphor or Sensitive- pramoxine)  up to 3 times per day to areas on body that are itchy.      Melanoma ABCDEs  Melanoma is the most dangerous type of skin cancer, and is the leading cause of death from skin disease.  You are more likely to develop melanoma if you: Have light-colored skin, light-colored eyes, or red or blond hair Spend a lot of time in the sun Tan regularly, either outdoors or in a tanning bed Have had blistering sunburns, especially during childhood Have a close family member who has had a melanoma Have atypical moles or large birthmarks  Early detection of melanoma is key since treatment is typically straightforward and cure rates are extremely high if we catch it early.   The first sign of melanoma is often a change in a mole or a new dark spot.  The ABCDE system is a way of remembering the signs of melanoma.  A for asymmetry:  The two halves do not match. B for border:  The edges of the growth are irregular. C for color:  A mixture of colors are present instead of an even brown color. D for diameter:  Melanomas are usually (but not always) greater than 6mm - the size of a pencil eraser. E for evolution:  The spot keeps changing in size, shape, and color.  Please check your skin once per month between visits. You can use a small mirror in front and a large mirror behind you to keep an eye on the back side or your body.   If you see any  new or changing lesions before your next follow-up, please call to schedule a visit.  Please continue daily skin protection including broad spectrum sunscreen SPF 30+ to sun-exposed areas, reapplying every 2 hours as needed when you're outdoors.   Staying in the shade or wearing long sleeves, sun glasses (UVA+UVB protection) and wide brim hats (4-inch brim around the entire circumference of the hat) are also recommended for sun protection.    Due to recent changes in healthcare laws, you may see results of your pathology and/or laboratory studies on MyChart before the doctors have had a chance to review them. We understand that in some cases there may be results that are confusing or concerning to you. Please understand that not all results are received at the same time and often the doctors may need to interpret multiple results in order to provide you with the best plan of care or course of treatment. Therefore, we ask that you please give [place] 2 business days to thoroughly review all your results before contacting the office for clarification. Should we see a critical lab result, you will be contacted sooner.   If You Need Anything After Your Visit  If you have any questions or concerns for your doctor, please call our main line at [phone] and press option 4 to reach your doctor's medical assistant. If no one answers, please leave  a voicemail as directed and we will return your call as soon as possible. Messages left after 4 pm will be answered the following business day.   You may also send [place] a message via MyChart. We typically respond to MyChart messages within 1-2 business days.  For prescription refills, please ask your pharmacy to contact our office. Our fax number is [phone].  If you have an urgent issue when the clinic is closed that cannot wait until the next business day, you can page your doctor at the number below.    Please note that while we do our best to be available for  urgent issues outside of office hours, we are not available 24/7.   If you have an urgent issue and are unable to reach [place], you may choose to seek medical care at your doctor's office, retail clinic, urgent care center, or emergency room.  If you have a medical emergency, please immediately call 911 or go to the emergency department.  Pager Numbers  - Dr. Gwen Pounds: [phone]  - Dr. Neale Burly: [phone]  - Dr. Roseanne Reno: [phone]  In the event of inclement weather, please call our main line at [phone] for an update on the status of any delays or closures.  Dermatology Medication Tips: Please keep the boxes that topical medications come in in order to help keep track of the instructions about where and how to use these. Pharmacies typically print the medication instructions only on the boxes and not directly on the medication tubes.   If your medication is too expensive, please contact our office at [phone] option 4 or send [place] a message through MyChart.   We are unable to tell what your co-pay for medications will be in advance as this is different depending on your insurance coverage. However, we may be able to find a substitute medication at lower cost or fill out paperwork to get insurance to cover a needed medication.   If a prior authorization is required to get your medication covered by your insurance company, please allow [place] 1-2 business days to complete this process.  Drug prices often vary depending on where the prescription is filled and some pharmacies may offer cheaper prices.  The website www.goodrx.com contains coupons for medications through different pharmacies. The prices here do not account for what the cost may be with help from insurance (it may be cheaper with your insurance), but the website can give you the price if you did not use any insurance.  - You can print the associated coupon and take it with your prescription to the pharmacy.  - You may also  stop by our office during regular business hours and pick up a GoodRx coupon card.  - If you need your prescription sent electronically to a different pharmacy, notify our office through [facility] or by phone at [phone] option 4.     Si Usted Necesita Algo Despus de Su Visita  Tambin puede enviarnos un mensaje a travs de Clinical cytogeneticist. Por lo general respondemos a los mensajes de MyChart en el transcurso de 1 a 2 das hbiles.  Para renovar recetas, por favor pida a su farmacia que se ponga en contacto con nuestra oficina. Annie Sable de fax es [place] [phone].  Si tiene un asunto urgente cuando la clnica est cerrada y que no puede esperar hasta el siguiente da hbil, puede llamar/localizar a su doctor(a) al nmero que aparece a continuacin.   Por favor, tenga en cuenta que aunque hacemos todo  lo posible para estar disponibles para asuntos urgentes fuera del horario de oficina, no estamos disponibles las 24 horas del da, los 7 [address]  [address] de la [place].   Si tiene un problema urgente y no puede comunicarse con nosotros, puede optar por buscar atencin mdica  en el consultorio de su doctor(a), en una clnica privada, en un centro de atencin urgente o en una sala de emergencias.  Si tiene Engineer, drilling, por favor llame inmediatamente al 911 o vaya a la sala de emergencias.  Nmeros de bper  - Dr. Gwen Pounds: [phone]  - Dra. Moye: [phone]  - Dra. Roseanne Reno: [phone]  En caso de inclemencias del [place], por favor llame a Lacy Duverney principal al [phone] para una actualizacin sobre el [place] de cualquier retraso o cierre.  Consejos para la medicacin en dermatologa: Por favor, guarde las cajas en las que vienen los medicamentos de uso tpico para ayudarle a seguir las instrucciones sobre dnde y cmo usarlos. Las farmacias generalmente imprimen las instrucciones del medicamento slo en las cajas y no directamente en los tubos del [place].   Si  su medicamento es muy caro, por favor, pngase en contacto con Rolm Gala llamando al [phone] y presione la opcin 4 o envenos un mensaje a travs de Clinical cytogeneticist.   No podemos decirle cul ser su copago por los medicamentos por adelantado ya que esto es diferente dependiendo de la cobertura de su seguro. Sin embargo, es posible que podamos encontrar un medicamento sustituto a Audiological scientist un formulario para que el seguro cubra el medicamento que se considera necesario.   Si se requiere una autorizacin previa para que su compaa de seguros [place] su medicamento, por favor permtanos de 1 a 2 das hbiles para completar [address]  Los precios de los medicamentos varan con frecuencia dependiendo del Environmental consultant de dnde se surte la receta y alguna farmacias pueden ofrecer precios ms baratos.  El sitio web www.goodrx.com tiene cupones para medicamentos de Health and safety inspector. Los precios aqu no tienen en cuenta lo que podra costar con la ayuda del seguro (puede ser ms barato con su seguro), pero el sitio web puede darle el precio si no utiliz Tourist information centre manager.  - Puede imprimir el cupn correspondiente y llevarlo con su receta a la farmacia.  - Tambin puede pasar por nuestra oficina durante el horario de atencin regular y Education officer, museum una tarjeta de cupones de GoodRx.  - Si necesita que su receta se enve electrnicamente a una farmacia diferente, informe a nuestra oficina a travs de MyChart de [place] o por telfono llamando al [phone] y presione la opcin 4.

## 2023-06-30 NOTE — Progress Notes (Signed)
   New Patient Visit   Subjective  John Andersen is a 89 y.o. male who presents for the following: Skin Cancer Screening and Upper Body Skin Exam. No personal Hx of skin cancer.   The patient presents for Upper Body Skin Exam (UBSE) for skin cancer screening and mole check. The patient has spots, moles and lesions to be evaluated, some may be new or changing and the patient may have concern these could be cancer.  Wife is with patient and contributes to history.   The following portions of the chart were reviewed this encounter and updated as appropriate: medications, allergies, medical history  Review of Systems:  No other skin or systemic complaints except as noted in HPI or Assessment and Plan.  Objective  Well appearing patient in no apparent distress; mood and affect are within normal limits.  All skin waist up examined. Relevant physical exam findings are noted in the Assessment and Plan.  right supraclavicular x1 Erythematous keratotic or waxy stuck-on papule or plaque.    Assessment & Plan   Inflamed seborrheic keratosis right supraclavicular x1  Symptomatic, irritating, patient would like treated.  Destruction of lesion - right supraclavicular x1 Complexity: simple   Destruction method: cryotherapy   Informed consent: discussed and consent obtained   Timeout:  patient name, date of birth, surgical site, and procedure verified Lesion destroyed using liquid nitrogen: Yes   Region frozen until ice ball extended beyond lesion: Yes   Outcome: patient tolerated procedure well with no complications   Post-procedure details: wound care instructions given   Additional details:  Prior to procedure, discussed risks of blister formation, small wound, skin dyspigmentation, or rare scar following cryotherapy. Recommend Vaseline ointment to treated areas while healing.    Skin cancer screening performed today.  Actinic Damage - Chronic condition, secondary to cumulative  UV/sun exposure - diffuse scaly erythematous macules with underlying dyspigmentation - Recommend daily broad spectrum sunscreen SPF 30+ to sun-exposed areas, reapply every 2 hours as needed.  - Staying in the shade or wearing long sleeves, sun glasses (UVA+UVB protection) and wide brim hats (4-inch brim around the entire circumference of the hat) are also recommended for sun protection.  - Call for new or changing lesions.  Lentigines, Seborrheic Keratoses, Hemangiomas - Benign normal skin lesions - Benign-appearing - Call for any changes  Melanocytic Nevi - Tan-brown and/or pink-flesh-colored symmetric macules and papules - Benign appearing on exam today - Observation - Call clinic for new or changing moles - Recommend daily use of broad spectrum spf 30+ sunscreen to sun-exposed areas.    Return if symptoms worsen or fail to improve.  I, Lawson Radar, CMA, am acting as scribe for Armida Sans, MD.   Documentation: I have reviewed the above documentation for accuracy and completeness, and I agree with the above.  Armida Sans, MD

## 2023-07-01 ENCOUNTER — Encounter: Payer: Self-pay | Admitting: Dermatology

## 2023-07-01 DIAGNOSIS — I5022 Chronic systolic (congestive) heart failure: Secondary | ICD-10-CM | POA: Diagnosis present

## 2023-07-01 DIAGNOSIS — Z8679 Personal history of other diseases of the circulatory system: Secondary | ICD-10-CM

## 2023-07-06 DIAGNOSIS — Z0100 Encounter for examination of eyes and vision without abnormal findings: Secondary | ICD-10-CM | POA: Diagnosis not present

## 2023-07-06 DIAGNOSIS — Z961 Presence of intraocular lens: Secondary | ICD-10-CM | POA: Diagnosis not present

## 2023-07-06 DIAGNOSIS — H35373 Puckering of macula, bilateral: Secondary | ICD-10-CM | POA: Diagnosis not present

## 2023-07-06 DIAGNOSIS — H518 Other specified disorders of binocular movement: Secondary | ICD-10-CM | POA: Diagnosis not present

## 2023-07-11 ENCOUNTER — Other Ambulatory Visit
Admission: RE | Admit: 2023-07-11 | Discharge: 2023-07-11 | Disposition: A | Discharge status: Home / Self Care | Payer: Medicare HMO | Source: Ambulatory Visit | Attending: Nurse Practitioner | Admitting: Nurse Practitioner

## 2023-07-11 DIAGNOSIS — I428 Other cardiomyopathies: Secondary | ICD-10-CM | POA: Insufficient documentation

## 2023-07-11 LAB — BRAIN NATRIURETIC PEPTIDE: B Natriuretic Peptide: 412.8 pg/mL — ABNORMAL HIGH (ref 0.0–100.0)

## 2023-07-25 DIAGNOSIS — Z8679 Personal history of other diseases of the circulatory system: Secondary | ICD-10-CM | POA: Diagnosis not present

## 2023-07-25 DIAGNOSIS — I5022 Chronic systolic (congestive) heart failure: Secondary | ICD-10-CM | POA: Diagnosis not present

## 2023-07-25 DIAGNOSIS — I700 Atherosclerosis of aorta: Secondary | ICD-10-CM | POA: Diagnosis not present

## 2023-07-25 DIAGNOSIS — I428 Other cardiomyopathies: Secondary | ICD-10-CM | POA: Diagnosis not present

## 2023-07-25 DIAGNOSIS — I129 Hypertensive chronic kidney disease with stage 1 through stage 4 chronic kidney disease, or unspecified chronic kidney disease: Secondary | ICD-10-CM | POA: Diagnosis not present

## 2023-07-25 DIAGNOSIS — E7800 Pure hypercholesterolemia, unspecified: Secondary | ICD-10-CM | POA: Diagnosis not present

## 2023-07-25 DIAGNOSIS — G4733 Obstructive sleep apnea (adult) (pediatric): Secondary | ICD-10-CM | POA: Diagnosis not present

## 2023-07-25 DIAGNOSIS — R0602 Shortness of breath: Secondary | ICD-10-CM | POA: Diagnosis not present

## 2023-07-25 DIAGNOSIS — R062 Wheezing: Secondary | ICD-10-CM | POA: Diagnosis not present

## 2023-08-09 DIAGNOSIS — I5022 Chronic systolic (congestive) heart failure: Secondary | ICD-10-CM | POA: Diagnosis not present

## 2023-08-16 DIAGNOSIS — E063 Autoimmune thyroiditis: Secondary | ICD-10-CM | POA: Diagnosis not present

## 2023-08-16 DIAGNOSIS — E038 Other specified hypothyroidism: Secondary | ICD-10-CM | POA: Diagnosis not present

## 2023-08-16 DIAGNOSIS — E7800 Pure hypercholesterolemia, unspecified: Secondary | ICD-10-CM | POA: Diagnosis not present

## 2023-08-16 DIAGNOSIS — R7303 Prediabetes: Secondary | ICD-10-CM | POA: Diagnosis not present

## 2023-08-16 DIAGNOSIS — N183 Chronic kidney disease, stage 3 unspecified: Secondary | ICD-10-CM | POA: Diagnosis not present

## 2023-08-16 DIAGNOSIS — I129 Hypertensive chronic kidney disease with stage 1 through stage 4 chronic kidney disease, or unspecified chronic kidney disease: Secondary | ICD-10-CM | POA: Diagnosis not present

## 2023-08-17 DIAGNOSIS — I480 Paroxysmal atrial fibrillation: Secondary | ICD-10-CM | POA: Diagnosis not present

## 2023-08-23 DIAGNOSIS — M47815 Spondylosis without myelopathy or radiculopathy, thoracolumbar region: Secondary | ICD-10-CM | POA: Diagnosis not present

## 2023-08-23 DIAGNOSIS — I5022 Chronic systolic (congestive) heart failure: Secondary | ICD-10-CM | POA: Diagnosis not present

## 2023-08-23 DIAGNOSIS — E063 Autoimmune thyroiditis: Secondary | ICD-10-CM | POA: Diagnosis not present

## 2023-08-23 DIAGNOSIS — R7303 Prediabetes: Secondary | ICD-10-CM | POA: Diagnosis not present

## 2023-08-23 DIAGNOSIS — I130 Hypertensive heart and chronic kidney disease with heart failure and stage 1 through stage 4 chronic kidney disease, or unspecified chronic kidney disease: Secondary | ICD-10-CM | POA: Diagnosis not present

## 2023-08-23 DIAGNOSIS — Z0000 Encounter for general adult medical examination without abnormal findings: Secondary | ICD-10-CM | POA: Diagnosis not present

## 2023-08-23 DIAGNOSIS — I700 Atherosclerosis of aorta: Secondary | ICD-10-CM | POA: Diagnosis not present

## 2023-08-23 DIAGNOSIS — M546 Pain in thoracic spine: Secondary | ICD-10-CM | POA: Diagnosis not present

## 2023-08-23 DIAGNOSIS — R251 Tremor, unspecified: Secondary | ICD-10-CM | POA: Diagnosis not present

## 2023-08-23 DIAGNOSIS — N183 Chronic kidney disease, stage 3 unspecified: Secondary | ICD-10-CM | POA: Diagnosis not present

## 2023-08-24 DIAGNOSIS — I428 Other cardiomyopathies: Secondary | ICD-10-CM | POA: Diagnosis not present

## 2023-08-24 DIAGNOSIS — I5022 Chronic systolic (congestive) heart failure: Secondary | ICD-10-CM | POA: Diagnosis not present

## 2023-08-24 DIAGNOSIS — Z8679 Personal history of other diseases of the circulatory system: Secondary | ICD-10-CM | POA: Diagnosis not present

## 2023-08-24 DIAGNOSIS — H539 Unspecified visual disturbance: Secondary | ICD-10-CM | POA: Diagnosis not present

## 2023-08-24 DIAGNOSIS — R42 Dizziness and giddiness: Secondary | ICD-10-CM | POA: Diagnosis not present

## 2023-08-24 DIAGNOSIS — G4733 Obstructive sleep apnea (adult) (pediatric): Secondary | ICD-10-CM | POA: Diagnosis not present

## 2023-08-24 DIAGNOSIS — E7800 Pure hypercholesterolemia, unspecified: Secondary | ICD-10-CM | POA: Diagnosis not present

## 2023-08-24 DIAGNOSIS — I129 Hypertensive chronic kidney disease with stage 1 through stage 4 chronic kidney disease, or unspecified chronic kidney disease: Secondary | ICD-10-CM | POA: Diagnosis not present

## 2023-08-24 DIAGNOSIS — I700 Atherosclerosis of aorta: Secondary | ICD-10-CM | POA: Diagnosis not present

## 2023-08-30 ENCOUNTER — Other Ambulatory Visit: Payer: Self-pay | Admitting: Internal Medicine

## 2023-08-30 DIAGNOSIS — T148XXA Other injury of unspecified body region, initial encounter: Secondary | ICD-10-CM

## 2023-08-30 DIAGNOSIS — M546 Pain in thoracic spine: Secondary | ICD-10-CM

## 2023-09-01 DIAGNOSIS — M549 Dorsalgia, unspecified: Secondary | ICD-10-CM | POA: Diagnosis not present

## 2023-09-01 DIAGNOSIS — G20A1 Parkinson's disease without dyskinesia, without mention of fluctuations: Secondary | ICD-10-CM | POA: Diagnosis not present

## 2023-09-01 DIAGNOSIS — T17310D Gastric contents in larynx causing asphyxiation, subsequent encounter: Secondary | ICD-10-CM | POA: Diagnosis not present

## 2023-09-01 DIAGNOSIS — H532 Diplopia: Secondary | ICD-10-CM | POA: Diagnosis not present

## 2023-09-02 ENCOUNTER — Ambulatory Visit
Admission: RE | Admit: 2023-09-02 | Discharge: 2023-09-02 | Disposition: A | Discharge status: Home / Self Care | Payer: Medicare HMO | Source: Ambulatory Visit | Attending: Internal Medicine | Admitting: Internal Medicine

## 2023-09-02 DIAGNOSIS — M47814 Spondylosis without myelopathy or radiculopathy, thoracic region: Secondary | ICD-10-CM | POA: Diagnosis not present

## 2023-09-02 DIAGNOSIS — M546 Pain in thoracic spine: Secondary | ICD-10-CM | POA: Insufficient documentation

## 2023-09-02 DIAGNOSIS — T148XXA Other injury of unspecified body region, initial encounter: Secondary | ICD-10-CM | POA: Insufficient documentation

## 2023-09-02 DIAGNOSIS — M5023 Other cervical disc displacement, cervicothoracic region: Secondary | ICD-10-CM | POA: Diagnosis not present

## 2023-09-02 DIAGNOSIS — M5134 Other intervertebral disc degeneration, thoracic region: Secondary | ICD-10-CM | POA: Diagnosis not present

## 2023-09-07 DIAGNOSIS — I428 Other cardiomyopathies: Secondary | ICD-10-CM | POA: Diagnosis not present

## 2023-09-07 DIAGNOSIS — R42 Dizziness and giddiness: Secondary | ICD-10-CM | POA: Diagnosis not present

## 2023-09-07 DIAGNOSIS — I10 Essential (primary) hypertension: Secondary | ICD-10-CM | POA: Diagnosis not present

## 2023-09-07 DIAGNOSIS — I6523 Occlusion and stenosis of bilateral carotid arteries: Secondary | ICD-10-CM | POA: Diagnosis not present

## 2023-09-23 ENCOUNTER — Other Ambulatory Visit: Payer: Self-pay

## 2023-09-23 ENCOUNTER — Observation Stay: Payer: Medicare HMO

## 2023-09-23 ENCOUNTER — Emergency Department: Payer: Medicare HMO

## 2023-09-23 ENCOUNTER — Observation Stay
Admission: EM | Admit: 2023-09-23 | Discharge: 2023-09-26 | Disposition: A | Discharge status: Home / Self Care | Payer: Medicare HMO | Attending: Internal Medicine | Admitting: Internal Medicine

## 2023-09-23 DIAGNOSIS — N138 Other obstructive and reflux uropathy: Secondary | ICD-10-CM | POA: Diagnosis present

## 2023-09-23 DIAGNOSIS — G4733 Obstructive sleep apnea (adult) (pediatric): Secondary | ICD-10-CM | POA: Diagnosis not present

## 2023-09-23 DIAGNOSIS — I4891 Unspecified atrial fibrillation: Secondary | ICD-10-CM | POA: Insufficient documentation

## 2023-09-23 DIAGNOSIS — R7989 Other specified abnormal findings of blood chemistry: Secondary | ICD-10-CM | POA: Diagnosis present

## 2023-09-23 DIAGNOSIS — I5020 Unspecified systolic (congestive) heart failure: Secondary | ICD-10-CM | POA: Diagnosis present

## 2023-09-23 DIAGNOSIS — N401 Enlarged prostate with lower urinary tract symptoms: Secondary | ICD-10-CM | POA: Diagnosis present

## 2023-09-23 DIAGNOSIS — H918X3 Other specified hearing loss, bilateral: Secondary | ICD-10-CM | POA: Diagnosis not present

## 2023-09-23 DIAGNOSIS — R0789 Other chest pain: Principal | ICD-10-CM | POA: Diagnosis present

## 2023-09-23 DIAGNOSIS — E039 Hypothyroidism, unspecified: Secondary | ICD-10-CM | POA: Insufficient documentation

## 2023-09-23 DIAGNOSIS — Z79899 Other long term (current) drug therapy: Secondary | ICD-10-CM | POA: Diagnosis not present

## 2023-09-23 DIAGNOSIS — N184 Chronic kidney disease, stage 4 (severe): Secondary | ICD-10-CM | POA: Diagnosis present

## 2023-09-23 DIAGNOSIS — Z8679 Personal history of other diseases of the circulatory system: Secondary | ICD-10-CM

## 2023-09-23 DIAGNOSIS — R42 Dizziness and giddiness: Secondary | ICD-10-CM

## 2023-09-23 DIAGNOSIS — R109 Unspecified abdominal pain: Secondary | ICD-10-CM | POA: Diagnosis present

## 2023-09-23 DIAGNOSIS — R079 Chest pain, unspecified: Secondary | ICD-10-CM | POA: Diagnosis not present

## 2023-09-23 DIAGNOSIS — I509 Heart failure, unspecified: Secondary | ICD-10-CM | POA: Insufficient documentation

## 2023-09-23 DIAGNOSIS — G20C Parkinsonism, unspecified: Secondary | ICD-10-CM | POA: Insufficient documentation

## 2023-09-23 DIAGNOSIS — Z7982 Long term (current) use of aspirin: Secondary | ICD-10-CM | POA: Diagnosis not present

## 2023-09-23 DIAGNOSIS — R072 Precordial pain: Secondary | ICD-10-CM | POA: Diagnosis not present

## 2023-09-23 DIAGNOSIS — I130 Hypertensive heart and chronic kidney disease with heart failure and stage 1 through stage 4 chronic kidney disease, or unspecified chronic kidney disease: Secondary | ICD-10-CM | POA: Diagnosis not present

## 2023-09-23 DIAGNOSIS — R1110 Vomiting, unspecified: Secondary | ICD-10-CM | POA: Diagnosis not present

## 2023-09-23 DIAGNOSIS — I428 Other cardiomyopathies: Secondary | ICD-10-CM

## 2023-09-23 DIAGNOSIS — R918 Other nonspecific abnormal finding of lung field: Secondary | ICD-10-CM | POA: Diagnosis not present

## 2023-09-23 DIAGNOSIS — I5022 Chronic systolic (congestive) heart failure: Secondary | ICD-10-CM | POA: Diagnosis present

## 2023-09-23 DIAGNOSIS — G930 Cerebral cysts: Secondary | ICD-10-CM | POA: Diagnosis not present

## 2023-09-23 DIAGNOSIS — R519 Headache, unspecified: Secondary | ICD-10-CM | POA: Diagnosis not present

## 2023-09-23 DIAGNOSIS — I6523 Occlusion and stenosis of bilateral carotid arteries: Secondary | ICD-10-CM | POA: Diagnosis not present

## 2023-09-23 HISTORY — DX: Unspecified abdominal pain: R10.9

## 2023-09-23 HISTORY — DX: Other chest pain: R07.89

## 2023-09-23 HISTORY — DX: Dizziness and giddiness: R42

## 2023-09-23 LAB — URINALYSIS, COMPLETE (UACMP) WITH MICROSCOPIC
Bilirubin Urine: NEGATIVE
Glucose, UA: NEGATIVE mg/dL
Hgb urine dipstick: NEGATIVE
Ketones, ur: NEGATIVE mg/dL
Nitrite: NEGATIVE
Protein, ur: NEGATIVE mg/dL
Specific Gravity, Urine: 1.01 (ref 1.005–1.030)
Squamous Epithelial / HPF: 0 /[HPF] (ref 0–5)
pH: 5 (ref 5.0–8.0)

## 2023-09-23 LAB — CBC
HCT: 30.2 % — ABNORMAL LOW (ref 39.0–52.0)
Hemoglobin: 10.3 g/dL — ABNORMAL LOW (ref 13.0–17.0)
MCH: 30.6 pg (ref 26.0–34.0)
MCHC: 34.1 g/dL (ref 30.0–36.0)
MCV: 89.6 fL (ref 80.0–100.0)
Platelets: 253 10*3/uL (ref 150–400)
RBC: 3.37 MIL/uL — ABNORMAL LOW (ref 4.22–5.81)
RDW: 13.1 % (ref 11.5–15.5)
WBC: 14.1 10*3/uL — ABNORMAL HIGH (ref 4.0–10.5)
nRBC: 0 % (ref 0.0–0.2)

## 2023-09-23 LAB — HEPATIC FUNCTION PANEL
ALT: 11 U/L (ref 0–44)
AST: 19 U/L (ref 15–41)
Albumin: 3.2 g/dL — ABNORMAL LOW (ref 3.5–5.0)
Alkaline Phosphatase: 72 U/L (ref 38–126)
Bilirubin, Direct: 0.1 mg/dL (ref 0.0–0.2)
Indirect Bilirubin: 0.6 mg/dL (ref 0.3–0.9)
Total Bilirubin: 0.7 mg/dL (ref 0.3–1.2)
Total Protein: 6.8 g/dL (ref 6.5–8.1)

## 2023-09-23 LAB — BASIC METABOLIC PANEL
Anion gap: 14 (ref 5–15)
BUN: 49 mg/dL — ABNORMAL HIGH (ref 8–23)
CO2: 21 mmol/L — ABNORMAL LOW (ref 22–32)
Calcium: 6.5 mg/dL — ABNORMAL LOW (ref 8.9–10.3)
Chloride: 103 mmol/L (ref 98–111)
Creatinine, Ser: 2.14 mg/dL — ABNORMAL HIGH (ref 0.61–1.24)
GFR, Estimated: 29 mL/min — ABNORMAL LOW (ref >60)
Glucose, Bld: 170 mg/dL — ABNORMAL HIGH (ref 70–99)
Potassium: 4.1 mmol/L (ref 3.5–5.1)
Sodium: 138 mmol/L (ref 135–145)

## 2023-09-23 LAB — D-DIMER, QUANTITATIVE: D-Dimer, Quant: 1.17 ug{FEU}/mL — ABNORMAL HIGH (ref 0.00–0.50)

## 2023-09-23 LAB — LIPASE, BLOOD: Lipase: 32 U/L (ref 11–51)

## 2023-09-23 LAB — TROPONIN I (HIGH SENSITIVITY): Troponin I (High Sensitivity): 43 ng/L — ABNORMAL HIGH (ref <18)

## 2023-09-23 MED ORDER — HYDROCODONE-ACETAMINOPHEN 5-325 MG PO TABS: 1.0000 | ORAL_TABLET | ORAL | Status: DC | PRN

## 2023-09-23 MED ORDER — ASPIRIN 81 MG PO TBEC
81.0000 mg | DELAYED_RELEASE_TABLET | Freq: Every day | ORAL | Status: DC
  Administered 2023-09-24 – 2023-09-26 (×3): 81 mg via ORAL
  Filled 2023-09-23 (×3): qty 1

## 2023-09-23 MED ORDER — LEVOTHYROXINE SODIUM 100 MCG PO TABS
100.0000 ug | ORAL_TABLET | Freq: Every day | ORAL | Status: DC
  Administered 2023-09-24 – 2023-09-26 (×3): 100 ug via ORAL
  Filled 2023-09-23 (×3): qty 1

## 2023-09-23 MED ORDER — ENOXAPARIN SODIUM 30 MG/0.3ML IJ SOSY
30.0000 mg | PREFILLED_SYRINGE | INTRAMUSCULAR | Status: DC
  Administered 2023-09-24 – 2023-09-25 (×2): 30 mg via SUBCUTANEOUS
  Filled 2023-09-23 (×2): qty 0.3

## 2023-09-23 MED ORDER — ASPIRIN 300 MG RE SUPP: 300.0000 mg | RECTAL | Status: AC

## 2023-09-23 MED ORDER — MORPHINE SULFATE (PF) 2 MG/ML IV SOLN: 2.0000 mg | INTRAVENOUS | Status: DC | PRN

## 2023-09-23 MED ORDER — NITROGLYCERIN 0.4 MG SL SUBL: 0.4000 mg | SUBLINGUAL_TABLET | SUBLINGUAL | Status: DC | PRN

## 2023-09-23 MED ORDER — TORSEMIDE 20 MG PO TABS
20.0000 mg | ORAL_TABLET | Freq: Every day | ORAL | Status: DC
  Administered 2023-09-23 – 2023-09-26 (×4): 20 mg via ORAL
  Filled 2023-09-23 (×4): qty 1

## 2023-09-23 MED ORDER — ACETAMINOPHEN 325 MG RE SUPP: 650.0000 mg | Freq: Four times a day (QID) | RECTAL | Status: DC | PRN

## 2023-09-23 MED ORDER — ASPIRIN 81 MG PO CHEW
324.0000 mg | CHEWABLE_TABLET | ORAL | Status: AC
  Administered 2023-09-23: 324 mg via ORAL
  Filled 2023-09-23: qty 4

## 2023-09-23 MED ORDER — ONDANSETRON HCL 4 MG/2ML IJ SOLN: 4.0000 mg | Freq: Four times a day (QID) | INTRAMUSCULAR | Status: DC | PRN

## 2023-09-23 MED ORDER — SODIUM CHLORIDE 0.9 % IV BOLUS
500.0000 mL | Freq: Once | INTRAVENOUS | Status: AC
  Administered 2023-09-23: 500 mL via INTRAVENOUS

## 2023-09-23 MED ORDER — ATORVASTATIN CALCIUM 20 MG PO TABS
40.0000 mg | ORAL_TABLET | Freq: Every day | ORAL | Status: DC
  Administered 2023-09-25: 40 mg via ORAL
  Filled 2023-09-23: qty 2

## 2023-09-23 MED ORDER — ACETAMINOPHEN 325 MG PO TABS: 650.0000 mg | ORAL_TABLET | Freq: Four times a day (QID) | ORAL | Status: DC | PRN

## 2023-09-23 MED ORDER — LOSARTAN POTASSIUM 50 MG PO TABS
100.0000 mg | ORAL_TABLET | Freq: Every day | ORAL | Status: DC
  Administered 2023-09-24 – 2023-09-26 (×3): 100 mg via ORAL
  Filled 2023-09-23 (×4): qty 2

## 2023-09-23 MED ORDER — ACETAMINOPHEN 325 MG PO TABS
650.0000 mg | ORAL_TABLET | ORAL | Status: DC | PRN
  Administered 2023-09-25: 650 mg via ORAL
  Filled 2023-09-23: qty 2

## 2023-09-23 MED ORDER — ASPIRIN 81 MG PO TBEC: 81.0000 mg | DELAYED_RELEASE_TABLET | Freq: Every day | ORAL | Status: DC

## 2023-09-23 MED ORDER — ENOXAPARIN SODIUM 80 MG/0.8ML IJ SOSY
1.0000 mg/kg | PREFILLED_SYRINGE | Freq: Once | INTRAMUSCULAR | Status: AC
  Administered 2023-09-23: 80 mg via SUBCUTANEOUS
  Filled 2023-09-23: qty 0.8

## 2023-09-23 NOTE — Assessment & Plan Note (Addendum)
HFmrEF (EF 40 to 45% 07/11/2023) Continue GDMT (not currently on beta-blocker due to prior concern for bradycardia) Daily weights

## 2023-09-23 NOTE — ED Triage Notes (Signed)
See first nurse note. 

## 2023-09-23 NOTE — Assessment & Plan Note (Signed)
History of isolated A-fib during hospitalization 2022 for COVID Holter August 2024 did not show any A-fib.  Showed few short runs of SVT and PACs Not on anticoagulation.

## 2023-09-23 NOTE — Assessment & Plan Note (Signed)
Resolved.  Patient reported recurrent dizziness, headache with an episode of vomiting on admission. Seen by cardiologist on 9/24 and carotid Doppler was ordered results unavailable.  S/p Holter 8/24 with occasional SVT, no A-fib Head CT non-acute --Symptomatic care per orders --PT evaluation --Check orthostatic vitals

## 2023-09-23 NOTE — Assessment & Plan Note (Addendum)
Patient presents with 2 days of chest pain, mostly on the right Had a nonischemic Lexiscan stress 07/2023 First troponin 43, EKG nonacute Continue to trend troponin Aspirin beta-blocker and statin, trial of nitroglycerin sublingual for pain Cardiology consult-Dr. Corky Sing contacted via secure chat and will see patient in the a.m.

## 2023-09-23 NOTE — ED Provider Notes (Signed)
[facility] Provider Note    Event Date/Time   First MD Initiated Contact with Patient 09/23/23 1714     (approximate)   History   Chest Pain   HPI  John Andersen is a 89 y.o. male with a history of hypertension, recent diagnosis of Parkinson's, chronic kidney disease who presents with complaints of chest pain.  Patient reports right central chest pain which has been present for over a day, he reports it was difficult for him to sleep at night.  Denies a history of blood clots.  No fevers chills or cough     Physical Exam   Triage Vital Signs: ED Triage Vitals [09/23/23 1443]  Encounter Vitals Group     BP 119/65     Systolic BP Percentile      Diastolic BP Percentile      Pulse Rate 88     Resp 18     Temp (!) 97.5 F (36.4 C)     Temp Source Oral     SpO2 95 %     Weight 80.7 kg (178 lb)     Height 1.651 m (5\' 5" )     Head Circumference      Peak Flow      Pain Score 6     Pain Loc      Pain Education      Exclude from Growth Chart     Most recent vital signs: Vitals:   09/23/23 1900 09/23/23 2012  BP: 136/64 (!) 149/71  Pulse: 96 97  Resp: (!) 29 (!) 30  Temp:  97.8 F (36.6 C)  SpO2: 98% 97%     General: Awake, no distress.  CV:  Good peripheral perfusion.  Resp:  Normal effort.  Clear to auscultation bilaterally Abd:  No distention.  Other:  No calf pain or swelling   ED Results / Procedures / Treatments   Labs (all labs ordered are listed, but only abnormal results are displayed) Labs Reviewed  BASIC METABOLIC PANEL - Abnormal; Notable for the following components:      Result Value   CO2 21 (*)    Glucose, Bld 170 (*)    BUN 49 (*)    Creatinine, Ser 2.14 (*)    Calcium 6.5 (*)    GFR, Estimated 29 (*)    All other components within normal limits  CBC - Abnormal; Notable for the following components:   WBC 14.1 (*)    RBC 3.37 (*)    Hemoglobin 10.3 (*)    HCT 30.2 (*)    All other components within  normal limits  D-DIMER, QUANTITATIVE - Abnormal; Notable for the following components:   D-Dimer, Quant 1.17 (*)    All other components within normal limits  TROPONIN I (HIGH SENSITIVITY) - Abnormal; Notable for the following components:   Troponin I (High Sensitivity) 43 (*)    All other components within normal limits  LIPOPROTEIN A (LPA)  CBC  CREATININE, SERUM  HEPATIC FUNCTION PANEL  LIPASE, BLOOD     EKG  ED ECG REPORT I, Jene Every, the attending physician, personally viewed and interpreted this ECG.  Date: 09/23/2023  Rhythm: normal sinus rhythm QRS Axis: normal Intervals: Right bundle branch block ST/T Wave abnormalities: normal Narrative Interpretation: no evidence of acute ischemia    RADIOLOGY Chest x-ray without acute abnormality    PROCEDURES:  Critical Care performed: yes  CRITICAL CARE Performed by: Jene Every   Total critical care  time: 30 minutes  Critical care time was exclusive of separately billable procedures and treating other patients.  Critical care was necessary to treat or prevent imminent or life-threatening deterioration.  Critical care was time spent personally by me on the following activities: development of treatment plan with patient and/or surrogate as well as nursing, discussions with consultants, evaluation of patient's response to treatment, examination of patient, obtaining history from patient or surrogate, ordering and performing treatments and interventions, ordering and review of laboratory studies, ordering and review of radiographic studies, pulse oximetry and re-evaluation of patient's condition.   Procedures   MEDICATIONS ORDERED IN ED: Medications  aspirin EC tablet 81 mg (has no administration in time range)  atorvastatin (LIPITOR) tablet 40 mg (has no administration in time range)  losartan (COZAAR) tablet 100 mg (has no administration in time range)  torsemide (DEMADEX) tablet 20 mg (has no  administration in time range)  levothyroxine (SYNTHROID) tablet 100 mcg (has no administration in time range)  aspirin chewable tablet 324 mg (has no administration in time range)    Or  aspirin suppository 300 mg (has no administration in time range)  aspirin EC tablet 81 mg (has no administration in time range)  nitroGLYCERIN (NITROSTAT) SL tablet 0.4 mg (has no administration in time range)  acetaminophen (TYLENOL) tablet 650 mg (has no administration in time range)  ondansetron (ZOFRAN) injection 4 mg (has no administration in time range)  enoxaparin (LOVENOX) injection 30 mg (has no administration in time range)  enoxaparin (LOVENOX) injection 80 mg (has no administration in time range)  acetaminophen (TYLENOL) tablet 650 mg (has no administration in time range)    Or  acetaminophen (TYLENOL) suppository 650 mg (has no administration in time range)  HYDROcodone-acetaminophen (NORCO/VICODIN) 5-325 MG per tablet 1-2 tablet (has no administration in time range)  morphine (PF) 2 MG/ML injection 2 mg (has no administration in time range)  sodium chloride 0.9 % bolus 500 mL (0 mLs Intravenous Stopped 09/23/23 1859)     IMPRESSION / MDM / ASSESSMENT AND PLAN / ED COURSE  I reviewed the triage vital signs and the nursing notes. Patient's presentation is most consistent with acute presentation with potential threat to life or bodily function.  Patient presents with chest pain as detailed above, differential includes ACS, angina, pneumonia, PE  Lab work is notable for elevated troponin of 43, EKG without evidence of acute ischemia.  Patient does have a history of CKD, his GFR is 29, radiology states GFR of 30 or higher needed for CT angiography, will give IV fluids  Have discussed with the hospitalist who will admit the patient, plan to obtain CT angiography after improving GFR        FINAL CLINICAL IMPRESSION(S) / ED DIAGNOSES   Final diagnoses:  Chest pain, unspecified type      Rx / DC Orders   ED Discharge Orders     None        Note:  This document was prepared using Dragon voice recognition software and may include unintentional dictation errors.   Jene Every, MD 09/23/23 2027

## 2023-09-23 NOTE — Assessment & Plan Note (Signed)
 Renal function at baseline

## 2023-09-23 NOTE — Assessment & Plan Note (Signed)
Intolerant of CPAP. ?

## 2023-09-23 NOTE — Assessment & Plan Note (Addendum)
Pt presented with right-sided chest pain, dizziness.  D-dimer elevated to 1.17 CKD-4 precludes CTA chest.   NM Perfusion scan was intermediate probability for PE.   Doppler U/S BLE's - negative for DVT Pre-test probability for PE is intermediate if accounting for pt's relative immobility IV heparin ordered, but pt pulled out IV and refuses a new IV to be placed Echo to assess for R heart strain Repeat D-dimer in AM

## 2023-09-23 NOTE — Assessment & Plan Note (Signed)
-   Continue home meds °

## 2023-09-23 NOTE — Assessment & Plan Note (Signed)
Increase nursing assistance for communication of needs

## 2023-09-23 NOTE — ED Triage Notes (Signed)
KC report - pt with chest pain in epigastric region that started 1.5 days ago. Pt also has BUE pain, HA, dizziness, vomiting x 1.

## 2023-09-23 NOTE — Assessment & Plan Note (Addendum)
Resolved. Patient reported epigastric pain, dizziness and vomiting x 1 and leukocytosis Normal LFT's and lipase. --Further evaluation if recurrent

## 2023-09-23 NOTE — H&P (Signed)
History and Physical    Patient: John Andersen ZOX:[id number] DOB: [birth year] DOA: 09/23/2023 DOS: the patient was seen and examined on 09/23/2023 PCP: Lauro Regulus, MD  Patient coming from: Home  Chief Complaint:  Chief Complaint  Patient presents with   Chest Pain    HPI: John Andersen is a 89 y.o. male with medical history significant for HTN, CKD 4, BPH, hypothyroidism from Hashimoto's thyroiditis, nonischemic cardiomyopathy with HFmrEF  (EF 45 to 50% 07/11/2023), moderate MR, nonischemic Lexiscan stress test 07/2023, Holter 2024 with occasional SVT (past history of A-fib during hospitalization 2022 for COVID) negative for and OSA intolerant of CPAP, moderate persistent asthma followed by pulmonology, last evaluated by his cardiologist on 9//24 when he complained of dizziness for which a carotid Doppler was ordered (results unavailable,Who presents to the ED with a 2-day history of the low sternal chest pain and epigastric pain, bilateral upper extremity pain, headache, dizziness and an episode of vomiting.  He denies cough, fever or chills. ED course and data review: Vitals within normal limits except for a brief period of tachypnea to 26. Labs: Troponin 43.  WBC 14,000, hemoglobin 10.3, creatinine at baseline of 2.14, D-dimer 1.17. Chest x-ray, EKG personally viewed and interpreted showing NSR with right bundle branch block at a rate of 91. Chest x-ray showing underinflation but otherwise nonacute. Patient treated with NS bolus and hospitalist consulted for admission for chest pain rule out.   Review of Systems: {ROS_Text:26778}  Past Medical History:  Diagnosis Date   Arthritis    Dizziness    Dyspnea    Elevated lipids    Environmental and seasonal allergies    GERD (gastroesophageal reflux disease)    HOH (hard of hearing)    Hypertension    Tremors of nervous system    Past Surgical History:  Procedure Laterality Date   CATARACT EXTRACTION W/ INTRAOCULAR LENS  IMPLANT Right    CATARACT EXTRACTION W/PHACO Left 06/07/2017   Procedure: CATARACT EXTRACTION PHACO AND INTRAOCULAR LENS PLACEMENT (IOC);  Surgeon: Galen Manila, MD;  Location: [facility] ORS;  Service: Ophthalmology;  Laterality: Left;  [place] 01:09 AP% 11.0 CDE 7.66 fluid pack lot #   leg laceration Left    Social History:  reports that he has never smoked. He has never used smokeless tobacco. He reports that he does not drink alcohol. No history on file for drug use.  No Known Allergies  No family history on file.  Prior to Admission medications   Medication Sig Start Date End Date Taking? Authorizing Provider  acetaminophen (TYLENOL) 650 MG CR tablet Take 1,300 mg by mouth 2 (two) times daily.     [provider]  amLODipine (NORVASC) 10 MG tablet Take by mouth. 03/17/21 03/17/22  [provider]  aspirin EC 81 MG tablet Take 81 mg by mouth daily.    [provider]  atorvastatin (LIPITOR) 40 MG tablet Take 40 mg by mouth daily at 6 PM.    [provider]  esomeprazole (NEXIUM) 40 MG capsule Take 40 mg by mouth every other day.  Patient not taking: Reported on 05/22/2021    [provider]  gabapentin (NEURONTIN) 100 MG capsule TAKE 2 CAPSULES THREE TIMES DAILY 07/31/20   [provider]  levothyroxine (SYNTHROID) 100 MCG tablet Take 100 mcg by mouth daily. 11/26/20   [provider]  loratadine (CLARITIN) 10 MG tablet Take 10 mg by mouth daily as needed for allergies.    [provider]  losartan (COZAAR) 100 MG tablet Take by mouth. 03/17/21 03/17/22  [provider]  Multiple Vitamins-Minerals (CENTRUM SILVER PO) Take 1 capsule by mouth daily. Patient not taking: Reported on 05/22/2021    [provider]  Omega-3 Fatty Acids (FISH OIL) 1000 MG CAPS Take 1 capsule by mouth daily.    [provider]  torsemide (DEMADEX) 20 MG tablet Take by mouth. 03/17/21 03/17/22  [provider]     Physical Exam: Vitals:   09/23/23 1443 09/23/23 1830 09/23/23 1900  BP: 119/65 (!) 144/74 136/64  Pulse: 88 95 96  Resp: 18 (!) 26 (!) 29  Temp: (!) 97.5 F (36.4 C)    TempSrc: Oral    SpO2: 95% 97% 98%  Weight: 80.7 kg    Height: 5\' 5"  (1.651 m)     Physical Exam  Labs on Admission: I have personally reviewed following labs and imaging studies  CBC: Recent Labs  Lab 09/23/23 1456  WBC 14.1*  HGB 10.3*  HCT 30.2*  MCV 89.6  PLT 253   Basic Metabolic Panel: Recent Labs  Lab 09/23/23 1456  NA 138  K 4.1  CL 103  CO2 21*  GLUCOSE 170*  BUN 49*  CREATININE 2.14*  CALCIUM 6.5*   GFR: Estimated Creatinine Clearance: 22 mL/min (A) (by C-G formula based on SCr of 2.14 mg/dL (H)). Liver Function Tests: No results for input(s): "AST", "ALT", "ALKPHOS", "BILITOT", "PROT", "ALBUMIN" in the last 168 hours. No results for input(s): "LIPASE", "AMYLASE" in the last 168 hours. No results for input(s): "AMMONIA" in the last 168 hours. Coagulation Profile: No results for input(s): "INR", "PROTIME" in the last 168 hours. Cardiac Enzymes: No results for input(s): "CKTOTAL", "CKMB", "CKMBINDEX", "TROPONINI" in the last 168 hours. BNP (last 3 results) No results for input(s): "PROBNP" in the last 8760 hours. HbA1C: No results for input(s): "HGBA1C" in the last 72 hours. CBG: No results for input(s): "GLUCAP" in the last 168 hours. Lipid Profile: No results for input(s): "CHOL", "HDL", "LDLCALC", "TRIG", "CHOLHDL", "LDLDIRECT" in the last 72 hours. Thyroid Function Tests: No results for input(s): "TSH", "T4TOTAL", "FREET4", "T3FREE", "THYROIDAB" in the last 72 hours. Anemia Panel: No results for input(s): "VITAMINB12", "FOLATE", "FERRITIN", "TIBC", "IRON", "RETICCTPCT" in the last 72 hours. Urine analysis: No results found for: "COLORURINE", "APPEARANCEUR", "LABSPEC", "PHURINE", "GLUCOSEU", "HGBUR", "BILIRUBINUR", "KETONESUR", "PROTEINUR", "UROBILINOGEN", "NITRITE",  "LEUKOCYTESUR"  Radiological Exams on Admission: DG Chest 2 View  Result Date: 09/23/2023 CLINICAL DATA:  Chest pain and epigastric pain for the last 1-1/2 days. Some dizziness and vomiting EXAM: CHEST - 2 VIEW COMPARISON:  X-ray 05/18/2021 FINDINGS: Underinflation. There is some linear opacity at the bases likely scar or atelectasis. No pneumothorax, effusion or edema. No consolidation. Normal cardiopericardial silhouette. Kyphotic x-ray obscures the apices. Degenerative changes of the spine IMPRESSION: Underinflation.  Kyphotic x-ray.  Basilar atelectasis or scar. Electronically Signed   By: Karen Kays M.D.   On: 09/23/2023 16:28     Data Reviewed: Relevant notes from primary care and specialist visits, past discharge summaries as available in EHR, including Care Everywhere. Prior diagnostic testing as pertinent to current admission diagnoses Updated medications and problem lists for reconciliation ED course, including vitals, labs, imaging, treatment and response to treatment Triage notes, nursing and pharmacy notes and ED provider's notes Notable results as noted in HPI   Assessment and Plan: * Precordial chest pain Patient presents with 2 days of chest pain, mostly on the right Had a nonischemic Lexiscan stress 07/2023 First troponin  43, EKG nonacute Continue to trend troponin Aspirin beta-blocker and statin, trial of nitroglycerin sublingual for pain Cardiology consult-Dr. Corky Sing contacted via secure chat and will see patient in the a.m.  Positive D dimer D-dimer elevated to 1.17 but renal function precludes CTA Will give one-time weight-based Lovenox and get V/Q in the morning to evaluate for PE  Dizziness Patient with recurrent dizziness, headache with an episode of vomiting.   Saw cardiologist on 9/24 and carotid Doppler was ordered results unavailable.  S/p Holter 8/24 with occasional SVT, no A-fib Will get head CT Follow-up carotid Doppler Symptom control with pain meds  and antiemetics PT eval  Cardiomyopathy, nonischemic (HCC) HFmrEF (EF 40 to 45% 07/11/2023) Continue GDMT (not currently on beta-blocker due to prior concern for bradycardia) Daily weights  Chronic kidney disease, stage 4 (severe) (HCC) Renal function at baseline  Other specified hearing loss, bilateral Increase nursing assistance for communication of needs  History of atrial fibrillation History of A-fib during hospitalization 2022 for COVID Holter August 2024 did not show any A-fib.  Showed few short runs of SVT and PACs  Obstructive sleep apnea Intolerant of CPAP  BPH with obstruction/lower urinary tract symptoms Continue home meds     DVT prophylaxis: Lovenox  Consults: Cardiology, Dr.Alluri  Advance Care Planning: full code  Family Communication: none  Disposition Plan: Back to previous home environment  Severity of Illness: The appropriate patient status for this patient is OBSERVATION. Observation status is judged to be reasonable and necessary in order to provide the required intensity of service to ensure the patient's safety. The patient's presenting symptoms, physical exam findings, and initial radiographic and laboratory data in the context of their medical condition is felt to place them at decreased risk for further clinical deterioration. Furthermore, it is anticipated that the patient will be medically stable for discharge from the hospital within 2 midnights of admission.   Author: Andris Baumann, MD 09/23/2023 8:04 PM  For on call review [email or web address].

## 2023-09-24 ENCOUNTER — Observation Stay: Payer: Medicare HMO

## 2023-09-24 ENCOUNTER — Encounter: Payer: Self-pay | Admitting: Internal Medicine

## 2023-09-24 DIAGNOSIS — R072 Precordial pain: Secondary | ICD-10-CM | POA: Diagnosis not present

## 2023-09-24 DIAGNOSIS — J9811 Atelectasis: Secondary | ICD-10-CM | POA: Diagnosis not present

## 2023-09-24 DIAGNOSIS — R0600 Dyspnea, unspecified: Secondary | ICD-10-CM | POA: Diagnosis not present

## 2023-09-24 DIAGNOSIS — R918 Other nonspecific abnormal finding of lung field: Secondary | ICD-10-CM | POA: Diagnosis not present

## 2023-09-24 DIAGNOSIS — R0789 Other chest pain: Secondary | ICD-10-CM | POA: Diagnosis not present

## 2023-09-24 DIAGNOSIS — I700 Atherosclerosis of aorta: Secondary | ICD-10-CM | POA: Diagnosis not present

## 2023-09-24 DIAGNOSIS — R0602 Shortness of breath: Secondary | ICD-10-CM | POA: Diagnosis not present

## 2023-09-24 LAB — TROPONIN I (HIGH SENSITIVITY): Troponin I (High Sensitivity): 100 ng/L (ref <18)

## 2023-09-24 MED ORDER — TECHNETIUM TO 99M ALBUMIN AGGREGATED
4.0000 | Freq: Once | INTRAVENOUS | Status: AC
  Administered 2023-09-24: 3.84 via INTRAVENOUS
  Filled 2023-09-24: qty 4

## 2023-09-24 NOTE — ED Notes (Signed)
Patient tried to climb out of bed. Helped to get up to use urinal. Alert and oriented x4. Wife in room. Took pants off. Patient requested not to have a gown on and wanted to keep his shirt on. Noted slight wheezing when getting up and repositioning in the bed. Noted clear upper airway with some scattered wheezes lower. Cleared when asked to cough. Call light within reach.

## 2023-09-24 NOTE — Progress Notes (Signed)
Progress Note   Patient: John Andersen WUJ:[id number] DOB: 07-Apr-1930 DOA: 09/23/2023     0 DOS: the patient was seen and examined on 09/24/2023   Brief hospital course: HPI on admission 09/23/2023 PM: "WINTON PASION is a 89 y.o. male with medical history significant for HTN, CKD 4, BPH, hypothyroidism from Hashimoto's thyroiditis, nonischemic cardiomyopathy with HFmrEF  (EF 45 to 50% 07/11/2023), moderate MR, nonischemic Lexiscan stress test 07/2023, Holter 2024 with occasional SVT (past history of A-fib during hospitalization 2022 for COVID) negative for and OSA intolerant of CPAP, moderate persistent asthma followed by pulmonology, last evaluated by his cardiologist on 9//24 when he complained of dizziness for which a carotid Doppler was ordered (results unavailable,Who presents to the ED with a 2-day history of the low sternal chest pain and epigastric pain, bilateral upper extremity pain, headache, dizziness and an episode of vomiting.  He denies cough, fever or chills. ED course and data review: Vitals within normal limits except for a brief period of tachypnea to 26. Labs: Troponin 43.  WBC 14,000, hemoglobin 10.3, creatinine at baseline of 2.14, D-dimer 1.17. Chest x-ray, EKG personally viewed and interpreted showing NSR with right bundle branch block at a rate of 91. Chest x-ray showing underinflation but otherwise nonacute. Patient treated with NS bolus and hospitalist consulted for admission for chest pain rule out"   Pt seen by cardiology and suspicion for cardiac etiology is felt to be low.  Chest pain resolved. No further cardiac evaluation indicated.  10/5 -- Awaiting results of VQ scan to hopefully rule out PE as cause of patient's chest pain and elevated D dimer. Pt remains chest pain free and stable.   Assessment and Plan: * Precordial chest pain Patient presents with 2 days of chest pain, mostly on the right side under the rib cage Had a nonischemic Lexiscan stress  07/2023 First troponin 43, EKG nonacute Continue to trend troponin Aspirin beta-blocker and statin, trial of nitroglycerin sublingual for pain Cardiology consult-Dr. Corky Sing contacted via secure chat and will see patient in the a.m.  Positive D dimer D-dimer elevated to 1.17 but renal function precludes CTA Will give one-time weight-based Lovenox and get V/Q in the morning to evaluate for PE  Abdominal pain Patient with epigastric pain, dizziness and vomiting x 1 and leukocytosis Added on hepatic function panel and lipase --further imaging if abnormal   Dizziness Patient with recurrent dizziness, headache with an episode of vomiting.   Saw cardiologist on 9/24 and carotid Doppler was ordered results unavailable.  S/p Holter 8/24 with occasional SVT, no A-fib Will get head CT Follow-up carotid Doppler Symptom control with pain meds and antiemetics PT eval  Cardiomyopathy, nonischemic (HCC) HFmrEF (EF 40 to 45% 07/11/2023) Continue GDMT (not currently on beta-blocker due to prior concern for bradycardia) Daily weights  Chronic kidney disease, stage 4 (severe) (HCC) Renal function at baseline  Other specified hearing loss, bilateral Increase nursing assistance for communication of needs  History of atrial fibrillation History of A-fib during hospitalization 2022 for COVID Holter August 2024 did not show any A-fib.  Showed few short runs of SVT and PACs  Obstructive sleep apnea Intolerant of CPAP  BPH with obstruction/lower urinary tract symptoms Continue home meds        Subjective: Pt seen in ED with family at bedside. He denies chest pain or other complaints.  He and family report he has quite a lot of dyspnea on exertion lately.  Mobility isn't good, uses walker but has severe  knee arthritis which limits him a great deal.    Physical Exam: Vitals:   09/24/23 0904 09/24/23 1318 09/24/23 1600 09/24/23 1735  BP: 122/86 127/60 98/64   Pulse: 87 91 86   Resp: 20 18 (!)  23   Temp: 98.2 F (36.8 C) 98.1 F (36.7 C)  98 F (36.7 C)  TempSrc: Oral Oral  Oral  SpO2: 96% 99% 99%   Weight:      Height:       General exam: awake, alert, no acute distress HEENT: moist mucus membranes, hearing grossly normal  Respiratory system: CTAB, no wheezes, rales or rhonchi, normal respiratory effort. Cardiovascular system: normal S1/S2, RRR, no JVD, murmurs, rubs, gallops, no pedal edema.   Gastrointestinal system: soft, NT, ND, no HSM felt, +bowel sounds. Central nervous system: A&O x3. no gross focal neurologic deficits, normal speech Extremities: moves all, soft knee brace sleeve on right knee, no edema, normal tone Skin: dry, intact, normal temperature, normal color, No rashes, lesions or ulcers Psychiatry: normal mood, congruent affect, judgement and insight appear normal    Data Reviewed:  Notable la bs --- bicarb 21, glucose 170, BUN 49, Cr 2.14, Ca 6.5, albumin 3.2, Trop 43, wbc 14.1, hbg 10.3  Family Communication: at bedside on rounds  Disposition: Status is: Observation The patient remains OBS appropriate and will d/c before 2 midnights. Remains admitted pending V/Q scan results   Planned Discharge Destination: Home    Time spent: 42 minutes  Author: Pennie Banter, DO 09/24/2023 6:48 PM  For on call review [email or web address].

## 2023-09-24 NOTE — Consult Note (Signed)
[name] Cardiology  CARDIOLOGY CONSULT NOTE  Patient ID: John Andersen MRN: [id number] DOB/AGE: 1930/03/04 89 y.o.  Admit date: 09/23/2023 Referring Physician Dr. Para March Primary Cardiology team: [facility] clinic Reason for Consultation Chest pain  HPI: 88 year old male with past medical history significant for mild nonischemic cardiomyopathy, heart failure with mildly reduced EF, hypertension, CKD stage IV who presented to hospital with right-sided chest pain and our service is consulted for the same.  Patient is hard of hearing, history obtained from patient and his wife at bedside.  2 days ago he started to have right chest pain which is on the lateral lower side of chest and upper abdomen with no radiation.  Continuous pain for 2 days.  States that it is currently resolved.  EKG showed sinus rhythm with right bundle branch block which is not new.  Troponin I minimal 43.  Currently he is resting comfortably in bed without any complaints.  At baseline patient with limited ambulation due to knee/hip/back pain issues.  Does limited ambulation with help of walker.  Has chronic exertional dyspnea which is unchanged.  No anginal chest pain symptoms.  Review of systems complete and found to be negative unless listed above     Past Medical History:  Diagnosis Date   Arthritis    Dizziness    Dyspnea    Elevated lipids    Environmental and seasonal allergies    GERD (gastroesophageal reflux disease)    HOH (hard of hearing)    Hypertension    Tremors of nervous system     Past Surgical History:  Procedure Laterality Date   CATARACT EXTRACTION W/ INTRAOCULAR LENS IMPLANT Right    CATARACT EXTRACTION W/PHACO Left 06/07/2017   Procedure: CATARACT EXTRACTION PHACO AND INTRAOCULAR LENS PLACEMENT (IOC);  Surgeon: Galen Manila, MD;  Location: [facility] ORS;  Service: Ophthalmology;  Laterality: Left;  [place] 01:09 AP% 11.0 CDE 7.66 fluid pack lot #   leg laceration Left     (Not in a hospital  admission)  Social History   Socioeconomic History   Marital status: Married    Spouse name: Not on file   Number of children: Not on file   Years of education: Not on file   Highest education level: Not on file  Occupational History   Not on file  Tobacco Use   Smoking status: Never   Smokeless tobacco: Never   Tobacco comments:    Never used Tobacco  Vaping Use   Vaping status: Never Used  Substance and Sexual Activity   Alcohol use: No   Drug use: Not on file   Sexual activity: Not on file  Other Topics Concern   Not on file  Social History Narrative   Not on file   Social Determinants of Health   Financial Resource Strain: Low Risk  (08/23/2023)   Received from [facility]   Overall Financial Resource Strain (CARDIA)    Difficulty of Paying Living Expenses: Not hard at all  Food Insecurity: No Food Insecurity (08/23/2023)   Received from [facility]   Hunger Vital Sign    Worried About Running Out of Food in the Last Year: Never true    Ran Out of Food in the Last Year: Never true  Transportation Needs: No Transportation Needs (08/23/2023)   Received from [facility] - Transportation    In the past 12 months, has lack of transportation kept you from medical appointments or from getting  medications?: No    Lack of Transportation (Non-Medical): No  Physical Activity: Not on file  Stress: Not on file  Social Connections: Not on file  Intimate Partner Violence: Not on file    No family history on file.    Review of systems complete and found to be negative unless listed above    PHYSICAL EXAM  Alert and oriented x 3 No pedal edema Grade 2 systolic murmur mitral area Basilar crackles heard No local tenderness in right upper abdomen or lower chest  Labs:   Lab Results  Component Value Date   WBC 14.1 (H) 09/23/2023   HGB 10.3 (L) 09/23/2023   HCT 30.2 (L) 09/23/2023   MCV 89.6 09/23/2023    PLT 253 09/23/2023    Recent Labs  Lab 09/23/23 1456  NA 138  K 4.1  CL 103  CO2 21*  BUN 49*  CREATININE 2.14*  CALCIUM 6.5*  PROT 6.8  BILITOT 0.7  ALKPHOS 72  ALT 11  AST 19  GLUCOSE 170*   No results found for: "CKTOTAL", "CKMB", "CKMBINDEX", "TROPONINI" No results found for: "CHOL" No results found for: "HDL" No results found for: "LDLCALC" No results found for: "TRIG" No results found for: "CHOLHDL" No results found for: "LDLDIRECT"    Radiology: CT HEAD WO CONTRAST ( )  Result Date: 09/23/2023 CLINICAL DATA:  New onset headache EXAM: CT HEAD WITHOUT CONTRAST TECHNIQUE: Contiguous axial images were obtained from the base of the skull through the vertex without intravenous contrast. RADIATION DOSE REDUCTION: This exam was performed according to the departmental dose-optimization program which includes automated exposure control, adjustment of the mA and/or kV according to patient size and/or use of iterative reconstruction technique. COMPARISON:  None Available. FINDINGS: Brain: There is no mass, hemorrhage or extra-axial collection. The size and configuration of the ventricles and extra-axial CSF spaces are normal. There is hypoattenuation of the white matter, most commonly indicating chronic small vessel disease. 12 mm left middle cranial fossa arachnoid cyst. Vascular: Atherosclerotic calcification of the internal carotid arteries at the skull base. No abnormal hyperdensity of the major intracranial arteries or dural venous sinuses. Skull: The visualized skull base, calvarium and extracranial soft tissues are normal. Sinuses/Orbits: Partial opacification of the right maxillary sinus. Ocular lens replacements. No mastoid or middle ear effusion. IMPRESSION: 1. No acute intracranial abnormality. 2. Chronic small vessel disease. Electronically Signed   By: Deatra Robinson M.D.   On: 09/23/2023 22:40   DG Chest 2 View  Result Date: 09/23/2023 CLINICAL DATA:  Chest pain and  epigastric pain for the last 1-1/2 days. Some dizziness and vomiting EXAM: CHEST - 2 VIEW COMPARISON:  X-ray 05/18/2021 FINDINGS: Underinflation. There is some linear opacity at the bases likely scar or atelectasis. No pneumothorax, effusion or edema. No consolidation. Normal cardiopericardial silhouette. Kyphotic x-ray obscures the apices. Degenerative changes of the spine IMPRESSION: Underinflation.  Kyphotic x-ray.  Basilar atelectasis or scar. Electronically Signed   By: Karen Kays M.D.   On: 09/23/2023 16:28   MR THORACIC SPINE WO CONTRAST  Result Date: 09/21/2023 CLINICAL DATA:  Midthoracic back pain EXAM: MRI THORACIC SPINE WITHOUT CONTRAST TECHNIQUE: Multiplanar, multisequence MR imaging of the thoracic spine was performed. No intravenous contrast was administered. COMPARISON:  None Available. FINDINGS: Alignment:  Physiologic. Vertebrae: No acute fracture, evidence of discitis, or aggressive bone lesion. Cord:  Normal signal and morphology. Paraspinal and other soft tissues: No acute paraspinal abnormality. Disc levels: Disc spaces: Degenerative disease with disc height loss at C5-6,  C6-7, C7-T1. Degenerative disease with disc height loss at T11-12 and T12-L1. C7-T1: Mild broad-based disc bulge. No foraminal or central canal stenosis. T1-T2: No disc protrusion, foraminal stenosis or central canal stenosis. T2-T3: No disc protrusion, foraminal stenosis or central canal stenosis. T3-T4: No disc protrusion, foraminal stenosis or central canal stenosis. T4-T5: No disc protrusion, foraminal stenosis or central canal stenosis. T5-T6: No disc protrusion, foraminal stenosis or central canal stenosis. T6-T7: No disc protrusion, foraminal stenosis or central canal stenosis. T7-T8: No disc protrusion, foraminal stenosis or central canal stenosis. T8-T9: No disc protrusion, foraminal stenosis or central canal stenosis. T9-T10: No disc protrusion, foraminal stenosis or central canal stenosis. T10-T11: No disc  protrusion, foraminal stenosis or central canal stenosis. T11-T12: Mild broad-based disc bulge. Mild bilateral facet arthropathy. No foraminal or central canal stenosis. IMPRESSION: 1. Mild thoracic spine spondylosis as described above. 2. No acute osseous injury of the thoracic spine. Electronically Signed   By: Elige Ko M.D.   On: 09/21/2023 09:17    EKG: Sinus rhythm, right bundle branch block  ASSESSMENT AND PLAN:  Atypical chest pain Stress test 07/2023 with no ischemia Minimal troponin elevation Mild cardiomyopathy Hypertension CKD 3-4, anemia Elderly with limited baseline functional status  89 year old elderly male with limited baseline functional status who presented with continuous right-sided lower lateral chest/upper abdominal discomfort with no ischemic EKG changes and minimal troponin.  I do not think his discomfort is cardiac related.  Currently his symptoms are resolved.  He had recent stress test with no significant ischemia.  Minimal troponin elevation likely in setting of his CKD. No further cardiac evaluation is indicated at this time.  Discussed plan with wife/patient and they are agreeable. Continue current medical therapy. Will sign off, call with questions.  Cardiology follow-up as outpatient.  Signed: Kathryne Gin MD,PhD, [facility] 09/24/2023, 10:11 AM

## 2023-09-25 ENCOUNTER — Observation Stay: Payer: Medicare HMO

## 2023-09-25 ENCOUNTER — Encounter: Payer: Self-pay | Admitting: Internal Medicine

## 2023-09-25 DIAGNOSIS — R0789 Other chest pain: Secondary | ICD-10-CM | POA: Diagnosis not present

## 2023-09-25 DIAGNOSIS — R072 Precordial pain: Secondary | ICD-10-CM | POA: Diagnosis not present

## 2023-09-25 DIAGNOSIS — R7989 Other specified abnormal findings of blood chemistry: Secondary | ICD-10-CM | POA: Diagnosis not present

## 2023-09-25 HISTORY — DX: Hypomagnesemia: E83.42

## 2023-09-25 LAB — RENAL FUNCTION PANEL
Albumin: 2.8 g/dL — ABNORMAL LOW (ref 3.5–5.0)
Anion gap: 15 (ref 5–15)
BUN: 46 mg/dL — ABNORMAL HIGH (ref 8–23)
CO2: 22 mmol/L (ref 22–32)
Calcium: 6.1 mg/dL — CL (ref 8.9–10.3)
Chloride: 102 mmol/L (ref 98–111)
Creatinine, Ser: 2.1 mg/dL — ABNORMAL HIGH (ref 0.61–1.24)
GFR, Estimated: 29 mL/min — ABNORMAL LOW (ref >60)
Glucose, Bld: 91 mg/dL (ref 70–99)
Phosphorus: 4.4 mg/dL (ref 2.5–4.6)
Potassium: 3.7 mmol/L (ref 3.5–5.1)
Sodium: 139 mmol/L (ref 135–145)

## 2023-09-25 LAB — CBC
HCT: 26.3 % — ABNORMAL LOW (ref 39.0–52.0)
Hemoglobin: 9.1 g/dL — ABNORMAL LOW (ref 13.0–17.0)
MCH: 30.4 pg (ref 26.0–34.0)
MCHC: 34.6 g/dL (ref 30.0–36.0)
MCV: 88 fL (ref 80.0–100.0)
Platelets: 203 10*3/uL (ref 150–400)
RBC: 2.99 MIL/uL — ABNORMAL LOW (ref 4.22–5.81)
RDW: 13.2 % (ref 11.5–15.5)
WBC: 11.4 10*3/uL — ABNORMAL HIGH (ref 4.0–10.5)
nRBC: 0 % (ref 0.0–0.2)

## 2023-09-25 LAB — MAGNESIUM: Magnesium: 0.8 mg/dL — CL (ref 1.7–2.4)

## 2023-09-25 LAB — TROPONIN I (HIGH SENSITIVITY)
Troponin I (High Sensitivity): 118 ng/L (ref <18)
Troponin I (High Sensitivity): 128 ng/L (ref <18)

## 2023-09-25 MED ORDER — ORAL CARE MOUTH RINSE: 15.0000 mL | OROMUCOSAL | Status: DC | PRN

## 2023-09-25 MED ORDER — HEPARIN (PORCINE) 25000 UT/250ML-% IV SOLN: 1300.0000 [IU]/h | INTRAVENOUS | Status: DC

## 2023-09-25 MED ORDER — MAGNESIUM SULFATE 4 GM/100ML IV SOLN
4.0000 g | Freq: Once | INTRAVENOUS | Status: AC
  Administered 2023-09-25: 4 g via INTRAVENOUS
  Filled 2023-09-25: qty 100

## 2023-09-25 MED ORDER — CALCIUM GLUCONATE-NACL 1-0.675 GM/50ML-% IV SOLN
1.0000 g | Freq: Once | INTRAVENOUS | Status: AC
  Administered 2023-09-25: 1000 mg via INTRAVENOUS
  Filled 2023-09-25: qty 50

## 2023-09-25 MED ORDER — HEPARIN BOLUS VIA INFUSION
5000.0000 [IU] | Freq: Once | INTRAVENOUS | Status: DC
  Filled 2023-09-25: qty 5000

## 2023-09-25 NOTE — Assessment & Plan Note (Signed)
Ca 6.1 corrects to 7.1 IV Ca-gluconate to replete Repeat labs in AM

## 2023-09-25 NOTE — Progress Notes (Signed)
PHARMACY - ANTICOAGULATION CONSULT NOTE  Pharmacy Consult for heparin infusion Indication: DVT  No Known Allergies  Patient Measurements: Height: 5\' 5"  (165.1 cm) Weight: 80.7 kg (178 lb) IBW/kg (Calculated) : 61.5 Heparin Dosing Weight: 78 kg  Vital Signs: Temp: 98.1 F (36.7 C) (10/06 1157) Temp Source: Oral (10/06 1157) BP: 116/74 (10/06 1157) Pulse Rate: 79 (10/06 1157)  Labs: Recent Labs    09/23/23 1456 09/24/23 1934 09/25/23 0420 09/25/23 1122  HGB 10.3*  --  9.1*  --   HCT 30.2*  --  26.3*  --   PLT 253  --  203  --   CREATININE 2.14*  --  2.10*  --   TROPONINIHS 43* 100*  --  118*    Estimated Creatinine Clearance: 22.4 mL/min (A) (by C-G formula based on SCr of 2.1 mg/dL (H)).   Medical History: Past Medical History:  Diagnosis Date   Arthritis    Dizziness    Dyspnea    Elevated lipids    Environmental and seasonal allergies    GERD (gastroesophageal reflux disease)    HOH (hard of hearing)    Hypertension    Tremors of nervous system     Medications:  Scheduled:   aspirin EC  81 mg Oral Daily   atorvastatin  40 mg Oral q1800   enoxaparin (LOVENOX) injection  30 mg Subcutaneous Q24H   levothyroxine  100 mcg Oral Daily   losartan  100 mg Oral Daily   torsemide  20 mg Oral Daily    Assessment:  89 y.o. male with a history of hypertension, recent diagnosis of Parkinson's, chronic kidney disease who presents with complaints of chest pain. A review of medical records reveals no chronic anticoagulation  Goal of Therapy:  Heparin level 0.3-0.7 units/ml Monitor platelets by anticoagulation protocol: Yes   Plan:  ---Give 5000 units bolus x 1 ---Start heparin infusion at 1300 units/hr ---Check anti-Xa level in 8 hours and daily while on heparin ---Continue to monitor H&H and platelets  Lowella Bandy 09/25/2023,12:40 PM

## 2023-09-25 NOTE — Plan of Care (Signed)

## 2023-09-25 NOTE — Assessment & Plan Note (Signed)
Severe - Mg 0.8 --4 g IV Mg-sulfate to replete --Follow Mg levels & replete as needed

## 2023-09-25 NOTE — Progress Notes (Signed)
Approximately 1320 patient removed IV and refused to allow staff to restart new IV site. Patient was very agitated stating how he wanted to go home. MD notified. Throughout shift, pt continued to refuse staff attempt to restart IV. Pt educated on importance of having IV site, including initiation of heparin gtt, pt verbalized understanding but continued to refuse. Pt spouse, son and daughter all present during exchange and they were unsuccessful at attempts to convince pt to allow IV start.  MD came to bedside shortly after 1600 to discuss plan with pt and family. Pt agreed with plan and to stay one more night in hospital-but still refusing IV start.  Pt also refused ambulatory O2 saturation monitoring on room air. Pt did agree to staff checking room air sat during trip to bathroom. O2 sat just after pt ambulated from bathroom to bed on room air noted to be 95% and pt denied shob or any other symptoms.

## 2023-09-25 NOTE — Progress Notes (Addendum)
Progress Note   Patient: John Andersen PIR:[id number] DOB: 03-13-1930 DOA: 09/23/2023     0 DOS: the patient was seen and examined on 09/25/2023   Brief hospital course: HPI on admission 09/23/2023 PM: "John Andersen is a 89 y.o. male with medical history significant for HTN, CKD 4, BPH, hypothyroidism from Hashimoto's thyroiditis, nonischemic cardiomyopathy with HFmrEF  (EF 45 to 50% 07/11/2023), moderate MR, nonischemic Lexiscan stress test 07/2023, Holter 2024 with occasional SVT (past history of A-fib during hospitalization 2022 for COVID) negative for and OSA intolerant of CPAP, moderate persistent asthma followed by pulmonology, last evaluated by his cardiologist on 9//24 when he complained of dizziness for which a carotid Doppler was ordered (results unavailable,Who presents to the ED with a 2-day history of the low sternal chest pain and epigastric pain, bilateral upper extremity pain, headache, dizziness and an episode of vomiting.  He denies cough, fever or chills. ED course and data review: Vitals within normal limits except for a brief period of tachypnea to 26. Labs: Troponin 43.  WBC 14,000, hemoglobin 10.3, creatinine at baseline of 2.14, D-dimer 1.17. Chest x-ray, EKG personally viewed and interpreted showing NSR with right bundle branch block at a rate of 91. Chest x-ray showing underinflation but otherwise nonacute. Patient treated with NS bolus and hospitalist consulted for admission for chest pain rule out"   Pt seen by cardiology and suspicion for cardiac etiology is felt to be low.  Chest pain resolved. No further cardiac evaluation indicated.  10/5 -- Awaiting results of VQ scan to hopefully rule out PE as cause of patient's chest pain and elevated D dimer.  Pt remains chest pain free and stable.  10/6 -- Perfusion scan intermediate probability.  Troponin trending up.  Mg 0.8, replaced.  Pt refusing IV to be replaced for empiric IV heparin.     Assessment and Plan: *  Atypical chest pain Patient presented with 2 days of right-sided chest pain, under the rib cage. Nonischemic Lexiscan stress 07/2023 First troponin 43, EKG nonacute --Cardiology consulted - non- cardiac etiology, signed off. --PE evaluation underway as outlined --Troponin trending up - trend to peak --EKG if chest pain  Positive D dimer Pt presented with right-sided chest pain, dizziness.  D-dimer elevated to 1.17 CKD-4 precludes CTA chest.   NM Perfusion scan was intermediate probability for PE.   Doppler U/S BLE's - negative for DVT Pre-test probability for PE is intermediate if accounting for pt's relative immobility IV heparin ordered, but pt pulled out IV and refuses a new IV to be placed Echo to assess for R heart strain Repeat D-dimer in AM  Abdominal pain Resolved. Patient reported epigastric pain, dizziness and vomiting x 1 and leukocytosis Normal LFT's and lipase. --Further evaluation if recurrent   Dizziness Resolved.  Patient reported recurrent dizziness, headache with an episode of vomiting on admission. Seen by cardiologist on 9/24 and carotid Doppler was ordered results unavailable.  S/p Holter 8/24 with occasional SVT, no A-fib Head CT non-acute --Symptomatic care per orders --PT evaluation --Check orthostatic vitals  Cardiomyopathy, nonischemic (HCC) HFmrEF (EF 40 to 45% 07/11/2023) Continue GDMT (not currently on beta-blocker due to prior concern for bradycardia) Daily weights  Chronic kidney disease, stage 4 (severe) (HCC) Renal function at baseline Unable to obtain CTA chest for PE evaluation.  Hypocalcemia Ca 6.1 corrects to 7.1 IV Ca-gluconate to replete Repeat labs in AM  Hypomagnesemia Severe - Mg 0.8 --4 g IV Mg-sulfate to replete --Follow Mg levels & replete as  needed  Other specified hearing loss, bilateral Increase nursing assistance for communication of needs  Heart failure with mildly reduced ejection fraction (HFmrEF) (HCC) See  cardiomyopathy  History of atrial fibrillation History of isolated A-fib during hospitalization 2022 for COVID Holter August 2024 did not show any A-fib.  Showed few short runs of SVT and PACs Not on anticoagulation.  Obstructive sleep apnea Intolerant of CPAP  BPH with obstruction/lower urinary tract symptoms Continue home meds        Subjective: Pt seen with wife and daughter at bedside today.  Pt pulled out his IV and is declining to have one replaced.  Explained use of IV heparin for potential PE while further evaluation is done.  He declines IV to be placed.  Per RN, pt not wanting any more tests and things done.  He denies SOB or recurrent CP.  He declined to ambulatory O2 sats checked.     Physical Exam: Vitals:   09/25/23 1157 09/25/23 1600 09/25/23 1615 09/25/23 1949  BP: 116/74  136/67 (!) 130/59  Pulse: 79 81 83 89  Resp: 18 18 16  (!) 21  Temp: 98.1 F (36.7 C)  98.3 F (36.8 C) (!) 97.5 F (36.4 C)  TempSrc: Oral  Oral Oral  SpO2: 97% 96% 97% 95%  Weight:      Height:       General exam: awake, appears drowsy, no acute distress HEENT: moist mucus membranes, hearing grossly normal  Respiratory system: CTAB, no wheezes, rales or rhonchi, normal respiratory effort. Cardiovascular system: normal S1/S2, RRR, no pedal edema.   Gastrointestinal system: soft, NT, ND Central nervous system: A&O x3. no gross focal neurologic deficits, normal speech Extremities: moves all, no edema, normal tone Skin: resting tremors in extremities, dry, intact, normal temperature Psychiatry: normal mood, congruent affect, judgement and insight appear normal    Data Reviewed:  Notable labs ---  Mg 0.8 BUN 46, Cr 2.10, GFR 29 - stable near baseline Calcium 6.1 / albumin 2.8 / corrected Ca 7.1 Troponin 43 >> 100 >> 118 >> 128 WBC 14.1 >> 11.4  Hbg 10.3 >> 9.1   Family Communication: wife and daughter at bedside on rounds  Disposition: Status is: Observation  Remains  admitted pending further evaluation for: Potential PE given diagnostic uncertainty. Rising troponin Severe electrolyte derangements    Planned Discharge Destination: Home    Time spent: 42 minutes  Author: Pennie Banter, DO 09/25/2023 8:15 PM  For on call review [email or web address].

## 2023-09-25 NOTE — Assessment & Plan Note (Signed)
See cardiomyopathy

## 2023-09-25 NOTE — Progress Notes (Signed)
PT Cancellation Note  Patient Details Name: John Andersen MRN: [id number] DOB: September 15, 1930   Cancelled Treatment:    Reason Eval/Treat Not Completed: Patient declined, no reason specified Pt stating that he is too weak to work with PT.Educated on the benefits and purpose of working with PT for that very reason. Pt continues to refuse.States that he will be ready in "a week or two."  Dymon Summerhill A Andrei Mccook 09/25/2023, 11:42 AM

## 2023-09-25 NOTE — Progress Notes (Signed)
Patient Troponin 100. MD made aware by pager, discussion.

## 2023-09-26 ENCOUNTER — Other Ambulatory Visit (HOSPITAL_COMMUNITY): Payer: Self-pay

## 2023-09-26 ENCOUNTER — Observation Stay
Admit: 2023-09-26 | Discharge: 2023-09-26 | Disposition: A | Discharge status: Home / Self Care | Payer: Medicare HMO | Attending: Internal Medicine

## 2023-09-26 DIAGNOSIS — R0789 Other chest pain: Secondary | ICD-10-CM | POA: Diagnosis not present

## 2023-09-26 DIAGNOSIS — I5022 Chronic systolic (congestive) heart failure: Secondary | ICD-10-CM | POA: Diagnosis not present

## 2023-09-26 LAB — RENAL FUNCTION PANEL
Albumin: 2.9 g/dL — ABNORMAL LOW (ref 3.5–5.0)
Anion gap: 12 (ref 5–15)
BUN: 58 mg/dL — ABNORMAL HIGH (ref 8–23)
CO2: 26 mmol/L (ref 22–32)
Calcium: 7.2 mg/dL — ABNORMAL LOW (ref 8.9–10.3)
Chloride: 103 mmol/L (ref 98–111)
Creatinine, Ser: 2.06 mg/dL — ABNORMAL HIGH (ref 0.61–1.24)
GFR, Estimated: 30 mL/min — ABNORMAL LOW (ref >60)
Glucose, Bld: 104 mg/dL — ABNORMAL HIGH (ref 70–99)
Phosphorus: 5.3 mg/dL — ABNORMAL HIGH (ref 2.5–4.6)
Potassium: 3.9 mmol/L (ref 3.5–5.1)
Sodium: 141 mmol/L (ref 135–145)

## 2023-09-26 LAB — ECHOCARDIOGRAM COMPLETE
AR max vel: 2.19 cm2
AV Area VTI: 2.2 cm2
AV Area mean vel: 2.18 cm2
AV Mean grad: 4 mm[Hg]
AV Peak grad: 7.8 mm[Hg]
Ao pk vel: 1.4 m/s
Area-P 1/2: 3.99 cm2
Height: 65 in
MV VTI: 2.66 cm2
S' Lateral: 3.8 cm
Weight: 2848 [oz_av]

## 2023-09-26 LAB — MAGNESIUM: Magnesium: 1.8 mg/dL (ref 1.7–2.4)

## 2023-09-26 LAB — CBC
HCT: 28.9 % — ABNORMAL LOW (ref 39.0–52.0)
Hemoglobin: 9.7 g/dL — ABNORMAL LOW (ref 13.0–17.0)
MCH: 30.1 pg (ref 26.0–34.0)
MCHC: 33.6 g/dL (ref 30.0–36.0)
MCV: 89.8 fL (ref 80.0–100.0)
Platelets: 238 10*3/uL (ref 150–400)
RBC: 3.22 MIL/uL — ABNORMAL LOW (ref 4.22–5.81)
RDW: 13.2 % (ref 11.5–15.5)
WBC: 9.7 10*3/uL (ref 4.0–10.5)
nRBC: 0 % (ref 0.0–0.2)

## 2023-09-26 LAB — HEPARIN LEVEL (UNFRACTIONATED)
Heparin Unfractionated: <0.1 [IU]/mL — ABNORMAL LOW (ref 0.30–0.70)
Heparin Unfractionated: <0.1 [IU]/mL — ABNORMAL LOW (ref 0.30–0.70)

## 2023-09-26 LAB — LIPOPROTEIN A (LPA): Lipoprotein (a): 113 nmol/L — ABNORMAL HIGH (ref <75.0)

## 2023-09-26 LAB — D-DIMER, QUANTITATIVE: D-Dimer, Quant: 1.76 ug{FEU}/mL — ABNORMAL HIGH (ref 0.00–0.50)

## 2023-09-26 LAB — BRAIN NATRIURETIC PEPTIDE: B Natriuretic Peptide: 408.4 pg/mL — ABNORMAL HIGH (ref 0.0–100.0)

## 2023-09-26 MED ORDER — HEPARIN BOLUS VIA INFUSION
2300.0000 [IU] | Freq: Once | INTRAVENOUS | Status: DC
  Filled 2023-09-26: qty 2300

## 2023-09-26 MED ORDER — CALCIUM CARBONATE 1250 (500 CA) MG PO TABS: 1.0000 | ORAL_TABLET | Freq: Every day | ORAL | Status: DC

## 2023-09-26 MED ORDER — LEVOTHYROXINE SODIUM 100 MCG PO TABS: 100.0000 ug | ORAL_TABLET | Freq: Every day | ORAL | 2 refills | Status: DC

## 2023-09-26 NOTE — Discharge Summary (Signed)
Physician Discharge Summary   Patient: John Andersen MRN: [id number] DOB: 09-04-1930  Admit date:     09/23/2023  Discharge date: 09/26/2023  Discharge Physician: Pennie Banter   PCP: Lauro Regulus, MD   Recommendations at discharge:   Follow up with Primary Care Follow up with Cardiology Repeat CBC, BMP, Mg at follow up  Discharge Diagnoses: Active Problems:   Positive D dimer   Cardiomyopathy, nonischemic (HCC)   Chronic kidney disease, stage 4 (severe) (HCC)   BPH with obstruction/lower urinary tract symptoms   Obstructive sleep apnea   History of atrial fibrillation   Heart failure with mildly reduced ejection fraction (HFmrEF) (HCC)   Other specified hearing loss, bilateral   Hypocalcemia  Principal Problem (Resolved):   Atypical chest pain Resolved Problems:   Dizziness   Abdominal pain   Hypomagnesemia  Hospital Course:  HPI on admission 09/23/2023 PM: "John Andersen is a 89 y.o. male with medical history significant for HTN, CKD 4, BPH, hypothyroidism from Hashimoto's thyroiditis, nonischemic cardiomyopathy with HFmrEF  (EF 45 to 50% 07/11/2023), moderate MR, nonischemic Lexiscan stress test 07/2023, Holter 2024 with occasional SVT (past history of A-fib during hospitalization 2022 for COVID) negative for and OSA intolerant of CPAP, moderate persistent asthma followed by pulmonology, last evaluated by his cardiologist on 9//24 when he complained of dizziness for which a carotid Doppler was ordered (results unavailable,Who presents to the ED with a 2-day history of the low sternal chest pain and epigastric pain, bilateral upper extremity pain, headache, dizziness and an episode of vomiting.  He denies cough, fever or chills. ED course and data review: Vitals within normal limits except for a brief period of tachypnea to 26. Labs: Troponin 43.  WBC 14,000, hemoglobin 10.3, creatinine at baseline of 2.14, D-dimer 1.17. Chest x-ray, EKG personally viewed and  interpreted showing NSR with right bundle branch block at a rate of 91. Chest x-ray showing underinflation but otherwise nonacute. Patient treated with NS bolus and hospitalist consulted for admission for chest pain rule out"     Pt seen by cardiology and suspicion for cardiac etiology is felt to be low.   Chest pain resolved. No further cardiac evaluation was deemed to be indicated.   10/5 -- Awaiting results of VQ scan to hopefully rule out PE as cause of patient's chest pain and elevated D dimer.  Pt remains chest pain free and stable.   10/6 -- Perfusion scan intermediate probability.  Troponin trending up but pt denying any recurrent chest pain.  Mg found severely low at 0.8, replaced.  Empiric IV heparin was ordered, but patient pulled out his IV and refused to have IV replaced for heparin.  He continued to decline despite explanation of treating for potential blood clot.  10/7 -- pt doing well today.  He reports some regurgitation earlier this AM without N/V and some loose stools both of which he states are chronic intermittent issues and not acutely worse. Patient ambulating on room air without complaints of dyspnea, dizziness or chest discomfort, and maintaining O2 sats on room air with ambulation as well.   Medically stable and pt adamantly requesting to be discharged home.  Pre-test probability for PE is low and clinically pt showing no clinical signs of PE.  Case discussed in detail with on call pulmonologist.  D-dimer elevation in CKD-4 is unreliable, non-specific and very unlikely caused by VTE in pt showing no clinical signs of symptoms.  Discussed risks and benefits of anticoagulation with  known as: TYLENOL Take 1,300 mg by mouth 2 (two) times daily.   aspirin EC 81 MG tablet Take 81 mg by mouth daily.   atorvastatin 40 MG tablet Commonly known as: LIPITOR Take 40 mg by mouth daily at 6 PM.   azelastine 0.1 % nasal spray Commonly known as: ASTELIN Place 2 sprays into the nose 2 (two) times daily.   calcium carbonate 1250 (500 Ca) MG tablet Commonly known as: OS-CAL - dosed in mg of elemental calcium Take 1 tablet (1,250 mg total) by mouth daily with breakfast.   carbidopa-levodopa 25-100 MG tablet Commonly known as: SINEMET IR Take 1 tablet by mouth 3 (three) times daily.    CENTRUM SILVER PO Take 1 capsule by mouth daily.   dextromethorphan-guaiFENesin 30-600 MG 12hr tablet Commonly known as: MUCINEX DM Take 1 tablet by mouth daily.   Fish Oil 1200 MG Caps Take 1 capsule by mouth daily.   gabapentin 100 MG capsule Commonly known as: NEURONTIN 100 mg 2 (two) times daily.   levothyroxine 100 MCG tablet Commonly known as: SYNTHROID Take 1 tablet (100 mcg total) by mouth daily. What changed:  medication strength how much to take when to take this   losartan 100 MG tablet Commonly known as: COZAAR Take 100 mg by mouth daily.   NexIUM 40 MG capsule Generic drug: esomeprazole Take 1 capsule by mouth at bedtime. What changed: Another medication with the same name was removed. Continue taking this medication, and follow the directions you see here.   spironolactone 25 MG tablet Commonly known as: ALDACTONE Take 25 mg by mouth daily.   tamsulosin 0.4 MG Caps capsule Commonly known as: FLOMAX Take 1 capsule by mouth daily after breakfast.   torsemide 20 MG tablet Commonly known as: DEMADEX Take 20 mg by mouth daily.        Discharge Exam: Filed Weights   09/23/23 1443  Weight: 80.7 kg   General exam: awake, alert, no acute distress HEENT: atraumatic, clear conjunctiva, anicteric sclera, moist mucus membranes, hearing grossly normal  Respiratory system: CTA, no wheezes, rales or rhonchi, normal respiratory effort. Cardiovascular system: normal S1/S2, RRR, no JVD, murmurs, rubs, gallops, no pedal edema.   Gastrointestinal system: soft, NT, ND, no HSM felt, +bowel sounds. Central nervous system: A&O x 3. no gross focal neurologic deficits, normal speech Extremities: moves all, no edema, normal tone Skin: dry, intact, normal temperature, normal color, No rashes, lesions or ulcers Psychiatry: normal mood, congruent affect, judgement and insight appear normal   Condition at discharge: stable  The results of significant diagnostics from  this hospitalization (including imaging, microbiology, ancillary and laboratory) are listed below for reference.   Imaging Studies: ECHOCARDIOGRAM COMPLETE  Result Date: 09/26/2023    ECHOCARDIOGRAM REPORT   Patient Name:   John Andersen Date of Exam: 09/26/2023 Medical Rec #:  [id number]      Height:       65.0 in Accession #:    [id number]     Weight:       178.0 lb Date of Birth:  1930/07/05       BSA:          1.883 m Patient Age:    89 years       BP:           134/71 mmHg Patient Gender: M              HR:           89  Physician Discharge Summary   Patient: John Andersen MRN: [id number] DOB: 09-04-1930  Admit date:     09/23/2023  Discharge date: 09/26/2023  Discharge Physician: Pennie Banter   PCP: Lauro Regulus, MD   Recommendations at discharge:   Follow up with Primary Care Follow up with Cardiology Repeat CBC, BMP, Mg at follow up  Discharge Diagnoses: Active Problems:   Positive D dimer   Cardiomyopathy, nonischemic (HCC)   Chronic kidney disease, stage 4 (severe) (HCC)   BPH with obstruction/lower urinary tract symptoms   Obstructive sleep apnea   History of atrial fibrillation   Heart failure with mildly reduced ejection fraction (HFmrEF) (HCC)   Other specified hearing loss, bilateral   Hypocalcemia  Principal Problem (Resolved):   Atypical chest pain Resolved Problems:   Dizziness   Abdominal pain   Hypomagnesemia  Hospital Course:  HPI on admission 09/23/2023 PM: "John Andersen is a 89 y.o. male with medical history significant for HTN, CKD 4, BPH, hypothyroidism from Hashimoto's thyroiditis, nonischemic cardiomyopathy with HFmrEF  (EF 45 to 50% 07/11/2023), moderate MR, nonischemic Lexiscan stress test 07/2023, Holter 2024 with occasional SVT (past history of A-fib during hospitalization 2022 for COVID) negative for and OSA intolerant of CPAP, moderate persistent asthma followed by pulmonology, last evaluated by his cardiologist on 9//24 when he complained of dizziness for which a carotid Doppler was ordered (results unavailable,Who presents to the ED with a 2-day history of the low sternal chest pain and epigastric pain, bilateral upper extremity pain, headache, dizziness and an episode of vomiting.  He denies cough, fever or chills. ED course and data review: Vitals within normal limits except for a brief period of tachypnea to 26. Labs: Troponin 43.  WBC 14,000, hemoglobin 10.3, creatinine at baseline of 2.14, D-dimer 1.17. Chest x-ray, EKG personally viewed and  interpreted showing NSR with right bundle branch block at a rate of 91. Chest x-ray showing underinflation but otherwise nonacute. Patient treated with NS bolus and hospitalist consulted for admission for chest pain rule out"     Pt seen by cardiology and suspicion for cardiac etiology is felt to be low.   Chest pain resolved. No further cardiac evaluation was deemed to be indicated.   10/5 -- Awaiting results of VQ scan to hopefully rule out PE as cause of patient's chest pain and elevated D dimer.  Pt remains chest pain free and stable.   10/6 -- Perfusion scan intermediate probability.  Troponin trending up but pt denying any recurrent chest pain.  Mg found severely low at 0.8, replaced.  Empiric IV heparin was ordered, but patient pulled out his IV and refused to have IV replaced for heparin.  He continued to decline despite explanation of treating for potential blood clot.  10/7 -- pt doing well today.  He reports some regurgitation earlier this AM without N/V and some loose stools both of which he states are chronic intermittent issues and not acutely worse. Patient ambulating on room air without complaints of dyspnea, dizziness or chest discomfort, and maintaining O2 sats on room air with ambulation as well.   Medically stable and pt adamantly requesting to be discharged home.  Pre-test probability for PE is low and clinically pt showing no clinical signs of PE.  Case discussed in detail with on call pulmonologist.  D-dimer elevation in CKD-4 is unreliable, non-specific and very unlikely caused by VTE in pt showing no clinical signs of symptoms.  Discussed risks and benefits of anticoagulation with  known as: TYLENOL Take 1,300 mg by mouth 2 (two) times daily.   aspirin EC 81 MG tablet Take 81 mg by mouth daily.   atorvastatin 40 MG tablet Commonly known as: LIPITOR Take 40 mg by mouth daily at 6 PM.   azelastine 0.1 % nasal spray Commonly known as: ASTELIN Place 2 sprays into the nose 2 (two) times daily.   calcium carbonate 1250 (500 Ca) MG tablet Commonly known as: OS-CAL - dosed in mg of elemental calcium Take 1 tablet (1,250 mg total) by mouth daily with breakfast.   carbidopa-levodopa 25-100 MG tablet Commonly known as: SINEMET IR Take 1 tablet by mouth 3 (three) times daily.    CENTRUM SILVER PO Take 1 capsule by mouth daily.   dextromethorphan-guaiFENesin 30-600 MG 12hr tablet Commonly known as: MUCINEX DM Take 1 tablet by mouth daily.   Fish Oil 1200 MG Caps Take 1 capsule by mouth daily.   gabapentin 100 MG capsule Commonly known as: NEURONTIN 100 mg 2 (two) times daily.   levothyroxine 100 MCG tablet Commonly known as: SYNTHROID Take 1 tablet (100 mcg total) by mouth daily. What changed:  medication strength how much to take when to take this   losartan 100 MG tablet Commonly known as: COZAAR Take 100 mg by mouth daily.   NexIUM 40 MG capsule Generic drug: esomeprazole Take 1 capsule by mouth at bedtime. What changed: Another medication with the same name was removed. Continue taking this medication, and follow the directions you see here.   spironolactone 25 MG tablet Commonly known as: ALDACTONE Take 25 mg by mouth daily.   tamsulosin 0.4 MG Caps capsule Commonly known as: FLOMAX Take 1 capsule by mouth daily after breakfast.   torsemide 20 MG tablet Commonly known as: DEMADEX Take 20 mg by mouth daily.        Discharge Exam: Filed Weights   09/23/23 1443  Weight: 80.7 kg   General exam: awake, alert, no acute distress HEENT: atraumatic, clear conjunctiva, anicteric sclera, moist mucus membranes, hearing grossly normal  Respiratory system: CTA, no wheezes, rales or rhonchi, normal respiratory effort. Cardiovascular system: normal S1/S2, RRR, no JVD, murmurs, rubs, gallops, no pedal edema.   Gastrointestinal system: soft, NT, ND, no HSM felt, +bowel sounds. Central nervous system: A&O x 3. no gross focal neurologic deficits, normal speech Extremities: moves all, no edema, normal tone Skin: dry, intact, normal temperature, normal color, No rashes, lesions or ulcers Psychiatry: normal mood, congruent affect, judgement and insight appear normal   Condition at discharge: stable  The results of significant diagnostics from  this hospitalization (including imaging, microbiology, ancillary and laboratory) are listed below for reference.   Imaging Studies: ECHOCARDIOGRAM COMPLETE  Result Date: 09/26/2023    ECHOCARDIOGRAM REPORT   Patient Name:   John Andersen Date of Exam: 09/26/2023 Medical Rec #:  [id number]      Height:       65.0 in Accession #:    [id number]     Weight:       178.0 lb Date of Birth:  1930/07/05       BSA:          1.883 m Patient Age:    89 years       BP:           134/71 mmHg Patient Gender: M              HR:           89  known as: TYLENOL Take 1,300 mg by mouth 2 (two) times daily.   aspirin EC 81 MG tablet Take 81 mg by mouth daily.   atorvastatin 40 MG tablet Commonly known as: LIPITOR Take 40 mg by mouth daily at 6 PM.   azelastine 0.1 % nasal spray Commonly known as: ASTELIN Place 2 sprays into the nose 2 (two) times daily.   calcium carbonate 1250 (500 Ca) MG tablet Commonly known as: OS-CAL - dosed in mg of elemental calcium Take 1 tablet (1,250 mg total) by mouth daily with breakfast.   carbidopa-levodopa 25-100 MG tablet Commonly known as: SINEMET IR Take 1 tablet by mouth 3 (three) times daily.    CENTRUM SILVER PO Take 1 capsule by mouth daily.   dextromethorphan-guaiFENesin 30-600 MG 12hr tablet Commonly known as: MUCINEX DM Take 1 tablet by mouth daily.   Fish Oil 1200 MG Caps Take 1 capsule by mouth daily.   gabapentin 100 MG capsule Commonly known as: NEURONTIN 100 mg 2 (two) times daily.   levothyroxine 100 MCG tablet Commonly known as: SYNTHROID Take 1 tablet (100 mcg total) by mouth daily. What changed:  medication strength how much to take when to take this   losartan 100 MG tablet Commonly known as: COZAAR Take 100 mg by mouth daily.   NexIUM 40 MG capsule Generic drug: esomeprazole Take 1 capsule by mouth at bedtime. What changed: Another medication with the same name was removed. Continue taking this medication, and follow the directions you see here.   spironolactone 25 MG tablet Commonly known as: ALDACTONE Take 25 mg by mouth daily.   tamsulosin 0.4 MG Caps capsule Commonly known as: FLOMAX Take 1 capsule by mouth daily after breakfast.   torsemide 20 MG tablet Commonly known as: DEMADEX Take 20 mg by mouth daily.        Discharge Exam: Filed Weights   09/23/23 1443  Weight: 80.7 kg   General exam: awake, alert, no acute distress HEENT: atraumatic, clear conjunctiva, anicteric sclera, moist mucus membranes, hearing grossly normal  Respiratory system: CTA, no wheezes, rales or rhonchi, normal respiratory effort. Cardiovascular system: normal S1/S2, RRR, no JVD, murmurs, rubs, gallops, no pedal edema.   Gastrointestinal system: soft, NT, ND, no HSM felt, +bowel sounds. Central nervous system: A&O x 3. no gross focal neurologic deficits, normal speech Extremities: moves all, no edema, normal tone Skin: dry, intact, normal temperature, normal color, No rashes, lesions or ulcers Psychiatry: normal mood, congruent affect, judgement and insight appear normal   Condition at discharge: stable  The results of significant diagnostics from  this hospitalization (including imaging, microbiology, ancillary and laboratory) are listed below for reference.   Imaging Studies: ECHOCARDIOGRAM COMPLETE  Result Date: 09/26/2023    ECHOCARDIOGRAM REPORT   Patient Name:   John Andersen Date of Exam: 09/26/2023 Medical Rec #:  [id number]      Height:       65.0 in Accession #:    [id number]     Weight:       178.0 lb Date of Birth:  1930/07/05       BSA:          1.883 m Patient Age:    89 years       BP:           134/71 mmHg Patient Gender: M              HR:           89  known as: TYLENOL Take 1,300 mg by mouth 2 (two) times daily.   aspirin EC 81 MG tablet Take 81 mg by mouth daily.   atorvastatin 40 MG tablet Commonly known as: LIPITOR Take 40 mg by mouth daily at 6 PM.   azelastine 0.1 % nasal spray Commonly known as: ASTELIN Place 2 sprays into the nose 2 (two) times daily.   calcium carbonate 1250 (500 Ca) MG tablet Commonly known as: OS-CAL - dosed in mg of elemental calcium Take 1 tablet (1,250 mg total) by mouth daily with breakfast.   carbidopa-levodopa 25-100 MG tablet Commonly known as: SINEMET IR Take 1 tablet by mouth 3 (three) times daily.    CENTRUM SILVER PO Take 1 capsule by mouth daily.   dextromethorphan-guaiFENesin 30-600 MG 12hr tablet Commonly known as: MUCINEX DM Take 1 tablet by mouth daily.   Fish Oil 1200 MG Caps Take 1 capsule by mouth daily.   gabapentin 100 MG capsule Commonly known as: NEURONTIN 100 mg 2 (two) times daily.   levothyroxine 100 MCG tablet Commonly known as: SYNTHROID Take 1 tablet (100 mcg total) by mouth daily. What changed:  medication strength how much to take when to take this   losartan 100 MG tablet Commonly known as: COZAAR Take 100 mg by mouth daily.   NexIUM 40 MG capsule Generic drug: esomeprazole Take 1 capsule by mouth at bedtime. What changed: Another medication with the same name was removed. Continue taking this medication, and follow the directions you see here.   spironolactone 25 MG tablet Commonly known as: ALDACTONE Take 25 mg by mouth daily.   tamsulosin 0.4 MG Caps capsule Commonly known as: FLOMAX Take 1 capsule by mouth daily after breakfast.   torsemide 20 MG tablet Commonly known as: DEMADEX Take 20 mg by mouth daily.        Discharge Exam: Filed Weights   09/23/23 1443  Weight: 80.7 kg   General exam: awake, alert, no acute distress HEENT: atraumatic, clear conjunctiva, anicteric sclera, moist mucus membranes, hearing grossly normal  Respiratory system: CTA, no wheezes, rales or rhonchi, normal respiratory effort. Cardiovascular system: normal S1/S2, RRR, no JVD, murmurs, rubs, gallops, no pedal edema.   Gastrointestinal system: soft, NT, ND, no HSM felt, +bowel sounds. Central nervous system: A&O x 3. no gross focal neurologic deficits, normal speech Extremities: moves all, no edema, normal tone Skin: dry, intact, normal temperature, normal color, No rashes, lesions or ulcers Psychiatry: normal mood, congruent affect, judgement and insight appear normal   Condition at discharge: stable  The results of significant diagnostics from  this hospitalization (including imaging, microbiology, ancillary and laboratory) are listed below for reference.   Imaging Studies: ECHOCARDIOGRAM COMPLETE  Result Date: 09/26/2023    ECHOCARDIOGRAM REPORT   Patient Name:   John Andersen Date of Exam: 09/26/2023 Medical Rec #:  [id number]      Height:       65.0 in Accession #:    [id number]     Weight:       178.0 lb Date of Birth:  1930/07/05       BSA:          1.883 m Patient Age:    89 years       BP:           134/71 mmHg Patient Gender: M              HR:           89  Physician Discharge Summary   Patient: John Andersen MRN: [id number] DOB: 09-04-1930  Admit date:     09/23/2023  Discharge date: 09/26/2023  Discharge Physician: Pennie Banter   PCP: Lauro Regulus, MD   Recommendations at discharge:   Follow up with Primary Care Follow up with Cardiology Repeat CBC, BMP, Mg at follow up  Discharge Diagnoses: Active Problems:   Positive D dimer   Cardiomyopathy, nonischemic (HCC)   Chronic kidney disease, stage 4 (severe) (HCC)   BPH with obstruction/lower urinary tract symptoms   Obstructive sleep apnea   History of atrial fibrillation   Heart failure with mildly reduced ejection fraction (HFmrEF) (HCC)   Other specified hearing loss, bilateral   Hypocalcemia  Principal Problem (Resolved):   Atypical chest pain Resolved Problems:   Dizziness   Abdominal pain   Hypomagnesemia  Hospital Course:  HPI on admission 09/23/2023 PM: "John Andersen is a 89 y.o. male with medical history significant for HTN, CKD 4, BPH, hypothyroidism from Hashimoto's thyroiditis, nonischemic cardiomyopathy with HFmrEF  (EF 45 to 50% 07/11/2023), moderate MR, nonischemic Lexiscan stress test 07/2023, Holter 2024 with occasional SVT (past history of A-fib during hospitalization 2022 for COVID) negative for and OSA intolerant of CPAP, moderate persistent asthma followed by pulmonology, last evaluated by his cardiologist on 9//24 when he complained of dizziness for which a carotid Doppler was ordered (results unavailable,Who presents to the ED with a 2-day history of the low sternal chest pain and epigastric pain, bilateral upper extremity pain, headache, dizziness and an episode of vomiting.  He denies cough, fever or chills. ED course and data review: Vitals within normal limits except for a brief period of tachypnea to 26. Labs: Troponin 43.  WBC 14,000, hemoglobin 10.3, creatinine at baseline of 2.14, D-dimer 1.17. Chest x-ray, EKG personally viewed and  interpreted showing NSR with right bundle branch block at a rate of 91. Chest x-ray showing underinflation but otherwise nonacute. Patient treated with NS bolus and hospitalist consulted for admission for chest pain rule out"     Pt seen by cardiology and suspicion for cardiac etiology is felt to be low.   Chest pain resolved. No further cardiac evaluation was deemed to be indicated.   10/5 -- Awaiting results of VQ scan to hopefully rule out PE as cause of patient's chest pain and elevated D dimer.  Pt remains chest pain free and stable.   10/6 -- Perfusion scan intermediate probability.  Troponin trending up but pt denying any recurrent chest pain.  Mg found severely low at 0.8, replaced.  Empiric IV heparin was ordered, but patient pulled out his IV and refused to have IV replaced for heparin.  He continued to decline despite explanation of treating for potential blood clot.  10/7 -- pt doing well today.  He reports some regurgitation earlier this AM without N/V and some loose stools both of which he states are chronic intermittent issues and not acutely worse. Patient ambulating on room air without complaints of dyspnea, dizziness or chest discomfort, and maintaining O2 sats on room air with ambulation as well.   Medically stable and pt adamantly requesting to be discharged home.  Pre-test probability for PE is low and clinically pt showing no clinical signs of PE.  Case discussed in detail with on call pulmonologist.  D-dimer elevation in CKD-4 is unreliable, non-specific and very unlikely caused by VTE in pt showing no clinical signs of symptoms.  Discussed risks and benefits of anticoagulation with

## 2023-09-26 NOTE — Progress Notes (Signed)
PT Cancellation Note  Patient Details Name: John Andersen MRN: [id number] DOB: 21-Oct-1930   Cancelled Treatment:    Reason Eval/Treat Not Completed: Patient declined, no reason specified. Pt refused PT reporting "PT isn't going to help my pain". Spent time encouraging and explaining benefits of mobility to Pt and family but Pt cont to refuse mobility at this time. Will attempt eval at a later date in hopes of participation.   Michole Lecuyer 09/26/2023, 10:00 AM

## 2023-09-26 NOTE — Progress Notes (Signed)
Transition of Care [facility]) - Inpatient Brief Assessment   Patient Details  Name: John Andersen MRN: [id number] Date of Birth: Sep 10, 1930  Transition of Care [facility]) CM/SW Contact:    Truddie Hidden, RN Phone Number: 09/26/2023, 12:33 PM   Clinical Narrative: TOC continuing to follow patient's progress throughout discharge planning.   Transition of Care Asessment: Insurance and Status: Insurance coverage has been reviewed Patient has primary care physician: Yes Home environment has been reviewed: home Prior level of function:: independent Prior/Current Home Services: No current home services Social Determinants of Health Reivew: SDOH reviewed no interventions necessary Readmission risk has been reviewed: Yes Transition of care needs: no transition of care needs at this time

## 2023-09-26 NOTE — Care Management Obs Status (Signed)
MEDICARE OBSERVATION STATUS NOTIFICATION   Patient Details  Name: John Andersen MRN: [id number] Date of Birth: 02-12-1930   Medicare Observation Status Notification Given:  Yes    Truddie Hidden, RN 09/26/2023, 12:01 PM

## 2023-09-26 NOTE — TOC Benefit Eligibility Note (Signed)
Patient Product/process development scientist completed.    The patient is insured through [employer]. Patient has Medicare and is not eligible for a copay card, but may be able to apply for patient assistance, if available.    Ran test claim for Farxiga 10 mg and the current 30 day co-pay is $95.00.  Ran test claim for Jaridance 10 mg and the current 30 day co-pay is $45.00.  This test claim was processed through [facility]- copay amounts may vary at other pharmacies due to pharmacy/plan contracts, or as the patient moves through the different stages of their insurance plan.     Roland Earl, CPHT Pharmacy Technician III Certified Patient Advocate [facility] Patient Advocate Team Direct Number: [phone]  Fax: [phone]

## 2023-09-26 NOTE — Plan of Care (Signed)

## 2023-09-26 NOTE — Progress Notes (Signed)
*  PRELIMINARY RESULTS* Echocardiogram 2D Echocardiogram has been performed.  Carolyne Fiscal 09/26/2023, 1:35 PM

## 2023-09-26 NOTE — Progress Notes (Addendum)
PHARMACY - ANTICOAGULATION CONSULT NOTE  Pharmacy Consult for heparin infusion Indication: DVT  No Known Allergies  Patient Measurements: Height: 5\' 5"  (165.1 cm) Weight: 80.7 kg (178 lb) IBW/kg (Calculated) : 61.5 Heparin Dosing Weight: 78 kg  Vital Signs: Temp: 98.3 F (36.8 C) (10/07 0034) Temp Source: Oral (10/07 0034) BP: 124/82 (10/07 0034) Pulse Rate: 88 (10/07 0034)  Labs: Recent Labs    09/23/23 1456 09/24/23 1934 09/25/23 0420 09/25/23 1122 09/25/23 1636 09/25/23 2139  HGB 10.3*  --  9.1*  --   --   --   HCT 30.2*  --  26.3*  --   --   --   PLT 253  --  203  --   --   --   HEPARINUNFRC  --   --   --   --   --  <0.10*  CREATININE 2.14*  --  2.10*  --   --   --   TROPONINIHS 43* 100*  --  118* 128*  --     Estimated Creatinine Clearance: 22.4 mL/min (A) (by C-G formula based on SCr of 2.1 mg/dL (H)).   Medical History: Past Medical History:  Diagnosis Date   Arthritis    Dizziness    Dyspnea    Elevated lipids    Environmental and seasonal allergies    GERD (gastroesophageal reflux disease)    HOH (hard of hearing)    Hypertension    Tremors of nervous system     Medications:  Scheduled:   aspirin EC  81 mg Oral Daily   atorvastatin  40 mg Oral q1800   heparin  5,000 Units Intravenous Once   levothyroxine  100 mcg Oral Daily   losartan  100 mg Oral Daily   torsemide  20 mg Oral Daily    Assessment:  89 y.o. male with a history of hypertension, recent diagnosis of Parkinson's, chronic kidney disease who presents with complaints of chest pain. A review of medical records reveals no chronic anticoagulation  Goal of Therapy:  Heparin level 0.3-0.7 units/ml Monitor platelets by anticoagulation protocol: Yes   1006 2139 HL < 0.1, subtherapeutic  Plan:  ---Per Dr. Denton Lank "Pt refusing IV to be replaced for empiric IV heparin " ---Pharmacy to F/U anticoag plan during AM rounding  Otelia Sergeant, PharmD, MBA 09/26/2023 1:16 AM

## 2023-10-05 ENCOUNTER — Encounter: Payer: Self-pay | Admitting: Internal Medicine

## 2023-11-28 DIAGNOSIS — Z8679 Personal history of other diseases of the circulatory system: Secondary | ICD-10-CM | POA: Diagnosis not present

## 2023-11-28 DIAGNOSIS — I700 Atherosclerosis of aorta: Secondary | ICD-10-CM | POA: Diagnosis not present

## 2023-11-28 DIAGNOSIS — E7800 Pure hypercholesterolemia, unspecified: Secondary | ICD-10-CM | POA: Diagnosis not present

## 2023-11-28 DIAGNOSIS — N183 Chronic kidney disease, stage 3 unspecified: Secondary | ICD-10-CM | POA: Diagnosis not present

## 2023-11-28 DIAGNOSIS — I129 Hypertensive chronic kidney disease with stage 1 through stage 4 chronic kidney disease, or unspecified chronic kidney disease: Secondary | ICD-10-CM | POA: Diagnosis not present

## 2023-11-28 DIAGNOSIS — I5022 Chronic systolic (congestive) heart failure: Secondary | ICD-10-CM | POA: Diagnosis not present

## 2023-11-30 DIAGNOSIS — M170 Bilateral primary osteoarthritis of knee: Secondary | ICD-10-CM | POA: Diagnosis not present

## 2024-02-18 DEATH — deceased
